# Patient Record
Sex: Male | Born: 1975 | Race: White | Hispanic: No | Smoking: Current every day smoker
Health system: Southern US, Community
[De-identification: ages and names within clinical notes are randomized; demographics above are authoritative.]

## PROBLEM LIST (undated history)

## (undated) DIAGNOSIS — I509 Heart failure, unspecified: Secondary | ICD-10-CM

## (undated) DIAGNOSIS — F1990 Other psychoactive substance use, unspecified, uncomplicated: Secondary | ICD-10-CM

## (undated) DIAGNOSIS — F1410 Cocaine abuse, uncomplicated: Secondary | ICD-10-CM

## (undated) DIAGNOSIS — E119 Type 2 diabetes mellitus without complications: Secondary | ICD-10-CM

## (undated) DIAGNOSIS — I219 Acute myocardial infarction, unspecified: Secondary | ICD-10-CM

## (undated) DIAGNOSIS — N189 Chronic kidney disease, unspecified: Secondary | ICD-10-CM

## (undated) DIAGNOSIS — I10 Essential (primary) hypertension: Secondary | ICD-10-CM

## (undated) DIAGNOSIS — F1110 Opioid abuse, uncomplicated: Secondary | ICD-10-CM

## (undated) HISTORY — PX: ANTERIOR CRUCIATE LIGAMENT REPAIR: SHX115

## (undated) HISTORY — DX: Chronic kidney disease, unspecified: N18.9

---
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
## 2008-09-26 ENCOUNTER — Emergency Department (HOSPITAL_COMMUNITY): Admission: EM | Admit: 2008-09-26 | Discharge: 2008-09-26 | Payer: Self-pay | Admitting: Emergency Medicine

## 2008-09-30 ENCOUNTER — Emergency Department (HOSPITAL_COMMUNITY): Admission: EM | Admit: 2008-09-30 | Discharge: 2008-10-01 | Payer: Self-pay | Admitting: Emergency Medicine

## 2009-02-14 ENCOUNTER — Emergency Department (HOSPITAL_COMMUNITY): Admission: EM | Admit: 2009-02-14 | Discharge: 2009-02-14 | Payer: Self-pay | Admitting: Emergency Medicine

## 2009-02-16 ENCOUNTER — Emergency Department (HOSPITAL_COMMUNITY): Admission: EM | Admit: 2009-02-16 | Discharge: 2009-02-16 | Payer: Self-pay | Admitting: Emergency Medicine

## 2012-10-14 ENCOUNTER — Encounter (INDEPENDENT_AMBULATORY_CARE_PROVIDER_SITE_OTHER): Payer: Self-pay | Admitting: General Surgery

## 2012-10-16 ENCOUNTER — Ambulatory Visit (INDEPENDENT_AMBULATORY_CARE_PROVIDER_SITE_OTHER): Payer: Managed Care, Other (non HMO) | Admitting: General Surgery

## 2014-05-14 DIAGNOSIS — I219 Acute myocardial infarction, unspecified: Secondary | ICD-10-CM

## 2014-05-14 HISTORY — DX: Acute myocardial infarction, unspecified: I21.9

## 2014-07-19 ENCOUNTER — Emergency Department (HOSPITAL_COMMUNITY)
Admission: EM | Admit: 2014-07-19 | Discharge: 2014-07-20 | Discharge status: Against Medical Advice | Payer: Managed Care, Other (non HMO) | Attending: Emergency Medicine | Admitting: Emergency Medicine

## 2014-07-19 ENCOUNTER — Encounter (HOSPITAL_COMMUNITY): Payer: Self-pay | Admitting: Emergency Medicine

## 2014-07-19 DIAGNOSIS — N189 Chronic kidney disease, unspecified: Secondary | ICD-10-CM | POA: Diagnosis not present

## 2014-07-19 DIAGNOSIS — R000 Tachycardia, unspecified: Secondary | ICD-10-CM | POA: Insufficient documentation

## 2014-07-19 DIAGNOSIS — R0602 Shortness of breath: Secondary | ICD-10-CM | POA: Insufficient documentation

## 2014-07-19 DIAGNOSIS — M7989 Other specified soft tissue disorders: Secondary | ICD-10-CM | POA: Insufficient documentation

## 2014-07-19 DIAGNOSIS — R61 Generalized hyperhidrosis: Secondary | ICD-10-CM | POA: Insufficient documentation

## 2014-07-19 DIAGNOSIS — R079 Chest pain, unspecified: Secondary | ICD-10-CM | POA: Insufficient documentation

## 2014-07-19 DIAGNOSIS — R200 Anesthesia of skin: Secondary | ICD-10-CM | POA: Diagnosis not present

## 2014-07-19 DIAGNOSIS — Z720 Tobacco use: Secondary | ICD-10-CM | POA: Diagnosis not present

## 2014-07-19 DIAGNOSIS — I201 Angina pectoris with documented spasm: Secondary | ICD-10-CM | POA: Diagnosis not present

## 2014-07-19 DIAGNOSIS — F419 Anxiety disorder, unspecified: Secondary | ICD-10-CM | POA: Insufficient documentation

## 2014-07-19 LAB — CBC
HCT: 37.9 % — ABNORMAL LOW (ref 39.0–52.0)
Hemoglobin: 12.1 g/dL — ABNORMAL LOW (ref 13.0–17.0)
MCH: 29.2 pg (ref 26.0–34.0)
MCHC: 31.9 g/dL (ref 30.0–36.0)
MCV: 91.3 fL (ref 78.0–100.0)
Platelets: 309 10*3/uL (ref 150–400)
RBC: 4.15 MIL/uL — AB (ref 4.22–5.81)
RDW: 12.7 % (ref 11.5–15.5)
WBC: 8.7 10*3/uL (ref 4.0–10.5)

## 2014-07-19 LAB — TROPONIN I: TROPONIN I: 0.13 ng/mL — AB (ref <0.031)

## 2014-07-19 LAB — BASIC METABOLIC PANEL
Anion gap: 7 (ref 5–15)
BUN: 17 mg/dL (ref 6–23)
CALCIUM: 8.8 mg/dL (ref 8.4–10.5)
CO2: 28 mmol/L (ref 19–32)
Chloride: 102 mmol/L (ref 96–112)
Creatinine, Ser: 0.95 mg/dL (ref 0.50–1.35)
GFR calc Af Amer: >90 mL/min (ref >90)
GFR calc non Af Amer: >90 mL/min (ref >90)
GLUCOSE: 133 mg/dL — AB (ref 70–99)
POTASSIUM: 3.7 mmol/L (ref 3.5–5.1)
SODIUM: 137 mmol/L (ref 135–145)

## 2014-07-19 LAB — BRAIN NATRIURETIC PEPTIDE: B Natriuretic Peptide: 143.1 pg/mL — ABNORMAL HIGH (ref 0.0–100.0)

## 2014-07-19 NOTE — ED Notes (Signed)
Pt states he has been having chest pain off and on for the past three to four weeks  States the pain is in his left chest up toward his left shoulder  Pt describes it as a pressure  Denies pain at this time but states when it comes it is a 6 or 7 on pain scale  Pt states he has also been having shortness of breath for the past 2 weeks that is worse on exertion   Pt states he has swelling in his lower extremities worse in the right than the left  Pt states he has numbness in his left foot from about the middle of his foot down to his toes

## 2014-07-20 ENCOUNTER — Encounter (HOSPITAL_COMMUNITY): Payer: Self-pay

## 2014-07-20 ENCOUNTER — Emergency Department (HOSPITAL_COMMUNITY): Payer: Managed Care, Other (non HMO)

## 2014-07-20 ENCOUNTER — Encounter (HOSPITAL_COMMUNITY): Payer: Self-pay | Admitting: Emergency Medicine

## 2014-07-20 ENCOUNTER — Inpatient Hospital Stay (HOSPITAL_COMMUNITY)
Admission: EM | Admit: 2014-07-20 | Discharge: 2014-07-22 | DRG: 287 | Disposition: A | Discharge status: Home / Self Care | Payer: Managed Care, Other (non HMO) | Attending: Internal Medicine | Admitting: Internal Medicine

## 2014-07-20 DIAGNOSIS — R7989 Other specified abnormal findings of blood chemistry: Secondary | ICD-10-CM | POA: Diagnosis present

## 2014-07-20 DIAGNOSIS — I5032 Chronic diastolic (congestive) heart failure: Secondary | ICD-10-CM | POA: Insufficient documentation

## 2014-07-20 DIAGNOSIS — R0600 Dyspnea, unspecified: Secondary | ICD-10-CM

## 2014-07-20 DIAGNOSIS — M1990 Unspecified osteoarthritis, unspecified site: Secondary | ICD-10-CM | POA: Diagnosis present

## 2014-07-20 DIAGNOSIS — R778 Other specified abnormalities of plasma proteins: Secondary | ICD-10-CM

## 2014-07-20 DIAGNOSIS — I129 Hypertensive chronic kidney disease with stage 1 through stage 4 chronic kidney disease, or unspecified chronic kidney disease: Secondary | ICD-10-CM | POA: Diagnosis present

## 2014-07-20 DIAGNOSIS — N189 Chronic kidney disease, unspecified: Secondary | ICD-10-CM | POA: Diagnosis present

## 2014-07-20 DIAGNOSIS — F17210 Nicotine dependence, cigarettes, uncomplicated: Secondary | ICD-10-CM | POA: Diagnosis present

## 2014-07-20 DIAGNOSIS — I420 Dilated cardiomyopathy: Secondary | ICD-10-CM | POA: Diagnosis present

## 2014-07-20 DIAGNOSIS — Z8249 Family history of ischemic heart disease and other diseases of the circulatory system: Secondary | ICD-10-CM

## 2014-07-20 DIAGNOSIS — M7989 Other specified soft tissue disorders: Secondary | ICD-10-CM | POA: Diagnosis present

## 2014-07-20 DIAGNOSIS — R748 Abnormal levels of other serum enzymes: Secondary | ICD-10-CM | POA: Diagnosis present

## 2014-07-20 DIAGNOSIS — Z6841 Body Mass Index (BMI) 40.0 and over, adult: Secondary | ICD-10-CM

## 2014-07-20 DIAGNOSIS — I10 Essential (primary) hypertension: Secondary | ICD-10-CM | POA: Insufficient documentation

## 2014-07-20 DIAGNOSIS — F1410 Cocaine abuse, uncomplicated: Secondary | ICD-10-CM | POA: Diagnosis present

## 2014-07-20 DIAGNOSIS — R079 Chest pain, unspecified: Secondary | ICD-10-CM | POA: Insufficient documentation

## 2014-07-20 DIAGNOSIS — E119 Type 2 diabetes mellitus without complications: Secondary | ICD-10-CM

## 2014-07-20 DIAGNOSIS — R0602 Shortness of breath: Secondary | ICD-10-CM | POA: Diagnosis present

## 2014-07-20 DIAGNOSIS — F1110 Opioid abuse, uncomplicated: Secondary | ICD-10-CM | POA: Diagnosis present

## 2014-07-20 DIAGNOSIS — I201 Angina pectoris with documented spasm: Principal | ICD-10-CM | POA: Diagnosis present

## 2014-07-20 DIAGNOSIS — I252 Old myocardial infarction: Secondary | ICD-10-CM

## 2014-07-20 DIAGNOSIS — F1990 Other psychoactive substance use, unspecified, uncomplicated: Secondary | ICD-10-CM

## 2014-07-20 LAB — CBC
HEMATOCRIT: 38.7 % — AB (ref 39.0–52.0)
Hemoglobin: 12.3 g/dL — ABNORMAL LOW (ref 13.0–17.0)
MCH: 29 pg (ref 26.0–34.0)
MCHC: 31.8 g/dL (ref 30.0–36.0)
MCV: 91.3 fL (ref 78.0–100.0)
Platelets: 307 10*3/uL (ref 150–400)
RBC: 4.24 MIL/uL (ref 4.22–5.81)
RDW: 12.7 % (ref 11.5–15.5)
WBC: 8.4 10*3/uL (ref 4.0–10.5)

## 2014-07-20 LAB — TSH: TSH: 1.143 u[IU]/mL (ref 0.350–4.500)

## 2014-07-20 LAB — TROPONIN I
TROPONIN I: 0.15 ng/mL — AB (ref <0.031)
TROPONIN I: 0.14 ng/mL — AB (ref <0.031)
Troponin I: 0.14 ng/mL — ABNORMAL HIGH (ref <0.031)

## 2014-07-20 LAB — ACETAMINOPHEN LEVEL: Acetaminophen (Tylenol), Serum: <10 ug/mL — ABNORMAL LOW (ref 10–30)

## 2014-07-20 LAB — RAPID URINE DRUG SCREEN, HOSP PERFORMED
AMPHETAMINES: NOT DETECTED
Barbiturates: NOT DETECTED
Benzodiazepines: NOT DETECTED
COCAINE: POSITIVE — AB
Opiates: POSITIVE — AB
Tetrahydrocannabinol: NOT DETECTED

## 2014-07-20 LAB — SALICYLATE LEVEL: Salicylate Lvl: <4 mg/dL (ref 2.8–20.0)

## 2014-07-20 LAB — CREATININE, SERUM
CREATININE: 1.04 mg/dL (ref 0.50–1.35)
GFR calc Af Amer: >90 mL/min (ref >90)
GFR, EST NON AFRICAN AMERICAN: 89 mL/min — AB (ref >90)

## 2014-07-20 LAB — D-DIMER, QUANTITATIVE: D-Dimer, Quant: 1.21 ug/mL-FEU — ABNORMAL HIGH (ref 0.00–0.48)

## 2014-07-20 LAB — BRAIN NATRIURETIC PEPTIDE: B NATRIURETIC PEPTIDE 5: 143 pg/mL — AB (ref 0.0–100.0)

## 2014-07-20 MED ORDER — ACETAMINOPHEN 325 MG PO TABS: 650.0000 mg | ORAL_TABLET | Freq: Four times a day (QID) | ORAL | Status: DC | PRN

## 2014-07-20 MED ORDER — IOHEXOL 350 MG/ML SOLN
100.0000 mL | Freq: Once | INTRAVENOUS | Status: AC | PRN
  Administered 2014-07-20: 100 mL via INTRAVENOUS

## 2014-07-20 MED ORDER — SODIUM CHLORIDE 0.9 % IJ SOLN: 3.0000 mL | INTRAMUSCULAR | Status: DC | PRN

## 2014-07-20 MED ORDER — TRAMADOL HCL 50 MG PO TABS
50.0000 mg | ORAL_TABLET | Freq: Four times a day (QID) | ORAL | Status: DC | PRN
  Administered 2014-07-20 – 2014-07-21 (×2): 50 mg via ORAL
  Filled 2014-07-20 (×2): qty 1

## 2014-07-20 MED ORDER — SODIUM CHLORIDE 0.9 % IV BOLUS (SEPSIS)
1000.0000 mL | Freq: Once | INTRAVENOUS | Status: AC
  Administered 2014-07-20: 1000 mL via INTRAVENOUS

## 2014-07-20 MED ORDER — SODIUM CHLORIDE 0.9 % IV BOLUS (SEPSIS)
1000.0000 mL | Freq: Once | INTRAVENOUS | Status: DC
Start: 2014-07-20 — End: 2014-07-20

## 2014-07-20 MED ORDER — POLYETHYLENE GLYCOL 3350 17 G PO PACK
17.0000 g | PACK | Freq: Every day | ORAL | Status: DC | PRN
  Filled 2014-07-20: qty 1

## 2014-07-20 MED ORDER — IOHEXOL 350 MG/ML SOLN: 100.0000 mL | Freq: Once | INTRAVENOUS | Status: DC | PRN

## 2014-07-20 MED ORDER — ZOLPIDEM TARTRATE 5 MG PO TABS: 5.0000 mg | ORAL_TABLET | Freq: Every evening | ORAL | Status: DC | PRN

## 2014-07-20 MED ORDER — ONDANSETRON HCL 4 MG PO TABS: 4.0000 mg | ORAL_TABLET | Freq: Four times a day (QID) | ORAL | Status: DC | PRN

## 2014-07-20 MED ORDER — CLONIDINE HCL 0.1 MG PO TABS
0.1000 mg | ORAL_TABLET | Freq: Once | ORAL | Status: AC
  Administered 2014-07-20: 0.1 mg via ORAL
  Filled 2014-07-20: qty 1

## 2014-07-20 MED ORDER — NICOTINE 21 MG/24HR TD PT24
21.0000 mg | MEDICATED_PATCH | Freq: Every day | TRANSDERMAL | Status: DC
Start: 2014-07-20 — End: 2014-07-23
  Administered 2014-07-20 – 2014-07-22 (×3): 21 mg via TRANSDERMAL
  Filled 2014-07-20 (×3): qty 1

## 2014-07-20 MED ORDER — HEPARIN SODIUM (PORCINE) 5000 UNIT/ML IJ SOLN
5000.0000 [IU] | Freq: Three times a day (TID) | INTRAMUSCULAR | Status: DC
  Administered 2014-07-20 – 2014-07-21 (×3): 5000 [IU] via SUBCUTANEOUS
  Filled 2014-07-20 (×3): qty 1

## 2014-07-20 MED ORDER — ONDANSETRON HCL 4 MG/2ML IJ SOLN: 4.0000 mg | Freq: Four times a day (QID) | INTRAMUSCULAR | Status: DC | PRN

## 2014-07-20 MED ORDER — IBUPROFEN 800 MG PO TABS
800.0000 mg | ORAL_TABLET | Freq: Four times a day (QID) | ORAL | Status: DC | PRN
  Administered 2014-07-21: 800 mg via ORAL
  Filled 2014-07-20: qty 1

## 2014-07-20 MED ORDER — ACETAMINOPHEN 650 MG RE SUPP
650.0000 mg | Freq: Four times a day (QID) | RECTAL | Status: DC | PRN
Start: 2014-07-20 — End: 2014-07-23

## 2014-07-20 MED ORDER — SODIUM CHLORIDE 0.9 % IV SOLN: 250.0000 mL | INTRAVENOUS | Status: DC | PRN

## 2014-07-20 MED ORDER — SODIUM CHLORIDE 0.9 % IJ SOLN
3.0000 mL | Freq: Two times a day (BID) | INTRAMUSCULAR | Status: DC
  Administered 2014-07-20 – 2014-07-21 (×2): 3 mL via INTRAVENOUS

## 2014-07-20 NOTE — Progress Notes (Signed)
Pt Larry Mason left the ER and went outside without telling any person on staff. Pts friend at bedside tried calling him but no answer. Pt did however text him and say he was in the parking lot. Friend is outside trying to talkk him into coming back and getting treated. Md aware of this situation Juanda Bond Registered Nurse,  Emergency Department

## 2014-07-20 NOTE — ED Provider Notes (Signed)
CSN: [id number]     Arrival date & time 07/19/14  2220 History   First MD Initiated Contact with Patient 07/20/14 0159     Chief Complaint  Patient presents with  . Chest Pain  . Shortness of Breath  . Leg Swelling    (Consider location/radiation/quality/duration/timing/severity/associated sxs/prior Treatment) HPI Comments: Patient is a 39 year old male who presents to the emergency department for further evaluation of chest pain which he describes as a pressure. Patient states that pain has been intermittent over the past 5 days. Patient states that his pain came on suddenly 5 days ago and radiated down his left arm. Patient describes the pain as "a bruising pain". He states that it lasted for approximately 1-2 hours before spontaneously resolving. He reports feeling short of breath as well as diaphoretic at onset of his symptoms. Patient reports that his chest pain has been coming and going since this time. He states that he notices the pressure mostly when he is lying flat. He reports that he feels a sensation of pressure, also, in his "body and face". He has noted that he has had some swelling to his right foot as well as subjective numbness to the plantar aspect of his left foot. Patient states "I feel like I'm walking on a rock all day". Patient denies any associated fever, nausea, vomiting, or syncope. He denies any recent prolonged travel or recent surgeries/hospitalizations. Patient denies a family history of ACS as well as a family history of DVT/PE. Patient has no personal history of ACS, DVT, PE, hypertension, or diabetes mellitus. He does report IV opiate drug use over the past year with his most recent use approximately one week ago.  Patient is a 39 y.o. male presenting with chest pain and shortness of breath. The history is provided by the patient. No language interpreter was used.  Chest Pain Associated symptoms: diaphoresis, numbness and shortness of breath   Associated symptoms: no  back pain, no fever, no nausea and not vomiting   Shortness of Breath Associated symptoms: chest pain and diaphoresis   Associated symptoms: no fever and no vomiting     Past Medical History  Diagnosis Date  . Chronic kidney disease 1984   Past Surgical History  Procedure Laterality Date  . Knee surgery     History reviewed. No pertinent family history. History  Substance Use Topics  . Smoking status: Current Every Day Smoker  . Smokeless tobacco: Not on file  . Alcohol Use: No    Review of Systems  Constitutional: Positive for diaphoresis. Negative for fever.  Respiratory: Positive for shortness of breath.   Cardiovascular: Positive for chest pain and leg swelling.  Gastrointestinal: Negative for nausea and vomiting.  Musculoskeletal: Negative for back pain.  Neurological: Positive for numbness. Negative for syncope.  All other systems reviewed and are negative.   Allergies  Review of patient's allergies indicates no known allergies.  Home Medications   Prior to Admission medications   Medication Sig Start Date End Date Taking? Authorizing Provider  ibuprofen (ADVIL,MOTRIN) 200 MG tablet Take 800 mg by mouth every 6 (six) hours as needed for moderate pain (pain and swelling).   Yes Historical Provider, MD   BP 143/75 mmHg  Pulse 92  Temp(Src) 98.5 F (36.9 C) (Oral)  Resp 18  Ht  (1.702 m)  Wt 260 lb (117.935 kg)  BMI 40.71 kg/m2  SpO2 96%   Physical Exam  Constitutional: He is oriented to person, place, and time. He appears  well-developed and well-nourished. No distress.  Nontoxic/nonseptic appearing  HENT:  Head: Normocephalic and atraumatic.  Eyes: Conjunctivae and EOM are normal. No scleral icterus.  Neck: Normal range of motion.  No JVD  Cardiovascular: Regular rhythm and intact distal pulses.   Tachycardic rate  Pulmonary/Chest: Effort normal. No respiratory distress. He has no wheezes. He has no rales.  Respirations even and unlabored   Musculoskeletal: Normal range of motion. He exhibits edema.  RLE swelling noted in R foot; no pitting edema  Neurological: He is alert and oriented to person, place, and time. He exhibits normal muscle tone. Coordination normal.  GCS 15. Patient moves extremities without ataxia. He ambulates with steady gait.  Skin: Skin is warm and dry. No rash noted. He is not diaphoretic. No erythema. No pallor.  Psychiatric: His speech is normal. His mood appears anxious. He is agitated.  Nursing note and vitals reviewed.   ED Course  Procedures (including critical care time) Labs Review Labs Reviewed  CBC - Abnormal; Notable for the following:    RBC 4.15 (*)    Hemoglobin 12.1 (*)    HCT 37.9 (*)    All other components within normal limits  BASIC METABOLIC PANEL - Abnormal; Notable for the following:    Glucose, Bld 133 (*)    All other components within normal limits  BRAIN NATRIURETIC PEPTIDE - Abnormal; Notable for the following:    B Natriuretic Peptide 143.1 (*)    All other components within normal limits  TROPONIN I - Abnormal; Notable for the following:    Troponin I 0.13 (*)    All other components within normal limits  TROPONIN I - Abnormal; Notable for the following:    Troponin I 0.15 (*)    All other components within normal limits  D-DIMER, QUANTITATIVE - Abnormal; Notable for the following:    D-Dimer, Quant 1.21 (*)    All other components within normal limits  TROPONIN I  URINE RAPID DRUG SCREEN (HOSP PERFORMED)  ETHANOL  ACETAMINOPHEN LEVEL  SALICYLATE LEVEL    Imaging Review Dg Chest 2 View  07/20/2014   CLINICAL DATA:  Intermittent chest pain for 3-4 weeks, chest pressure and shortness of breath. Lower extremity swelling and numbness.  EXAM: CHEST  2 VIEW  COMPARISON:  None.  FINDINGS: Cardiac silhouette is mildly enlarged, mediastinal silhouette is unremarkable. The lungs are clear without pleural effusions or focal consolidations. Trachea projects midline and  there is no pneumothorax. Soft tissue planes and included osseous structures are non-suspicious.  IMPRESSION: Mild cardiomegaly, no acute pulmonary process.   Electronically Signed   By: Awilda Metro   On: 07/20/2014 02:40     EKG Interpretation   Date/Time:  Monday July 19 2014 22:28:52 EST Ventricular Rate:  126 PR Interval:  126 QRS Duration: 98 QT Interval:  336 QTC Calculation: 486 R Axis:   14 Text Interpretation:  Sinus tachycardia Left ventricular hypertrophy  Nonspecific T wave abnormality Abnormal ECG Confirmed by OTTER  MD, OLGA  ([id number]) on 07/20/2014 4:31:06 AM      MDM   Final diagnoses:  Chest pain    0300 - Patient is a 39 year old male who presents to the emergency department for further evaluation of chest pain. Per nurse, patient became very anxious following my encounter with him because of his high likelihood of being admitted to the hospital. Patient expressed concern over his drug use history and withdrawal symptoms. Patient left the emergency department as a result. His friend is  attempting to locate him in hopes of him coming back to the ED to have his work up completed. Of note, Troponin-I increased from 0.13 to 0.15 on 3 hour redraw. EKG nonischemic. D dimer elevated to 1.21. Plan for CT angio study if patient returns.  7048 - Patient has not returned to the ED. Plan to disposition - Eloped/AMA.   Filed Vitals:   07/19/14 2234 07/20/14 0058  BP: 170/89 143/75  Pulse: 118 92  Temp: 98.5 F (36.9 C)   TempSrc: Oral   Resp: 18 18  Height: 5\' 7"  (1.702 m)   Weight: 260 lb (117.935 kg)   SpO2: 95% 96%     Antony Madura, PA-C 07/20/14 0413  Antony Madura, PA-C 07/20/14 8891  Marisa Severin, MD 07/20/14 [phone]

## 2014-07-20 NOTE — ED Notes (Signed)
Pt was seen here earlier tonight with c/o chest pain, shortness of breath and lower extremity edema  Pt had a complete work up that showed an elevated troponin and d dimer  The dr spoke to pt about being admitted into the hospital  Pt states he became anxious and left to go talk to his family and get his child to his ex wife  Pt returns this morning to be admitted

## 2014-07-20 NOTE — Progress Notes (Signed)
  Echocardiogram 2D Echocardiogram has been performed.  Delcie Roch 07/20/2014, 5:39 PM

## 2014-07-20 NOTE — ED Notes (Signed)
Patient ambulated to X-ray 

## 2014-07-20 NOTE — H&P (Addendum)
[facility] History and Physical  Larry Mason UDJ:[id number] DOB: 1975/07/20 DOA: 07/20/2014  Referring physician: Dr. Loretha Stapler PCP: No primary care provider on file.   Chief Complaint: SOB  HPI: Larry Mason is a 39 y.o. male no significant past medical history that comes left-sided chest pain and shortness of breath with radiation to the arm left arm. He relates his shortness of breath has progressively gotten worst. He relates it came on suddenly with radiating to his left arm. He relates some shortness of breath with diaphoresis. He also relates that his shortness of breath and chest pain is made worse with exertion. He also relates some swelling of his right foot where he has had a couple of surgeries. And some numbness on his left foot and he denies any fever, nausea, vomiting, diarrhea or syncope, he denies any long plane rides or car rides, also any recent leg surgeries. He does relate a positive history of IV drug abuse and cocaine.  In the ED: Basic metabolic panel was done that was within normal limits he has had 3 sets of cardiac enzymes which mildly positive but not increasing, his EKG just showed left ventricular hypertrophy. UDS positive for opiates and cocaine, his d-dimer was positive at 1.2 CT image of the chest was done that shows no PE and a CBC shows a hemoglobin of 12.1. BMP was 143.   Review of Systems:  Constitutional:  No weight loss, night sweats, Fevers, chills, fatigue.  HEENT:  No headaches, Difficulty swallowing,Tooth/dental problems,Sore throat,  No sneezing, itching, ear ache, nasal congestion, post nasal drip,  Cardio-vascular:  Orthopnea, PND, swelling in lower extremities, anasarca, dizziness, palpitations  GI:  No heartburn, indigestion, abdominal pain, nausea, vomiting, diarrhea, change in bowel habits, loss of appetite  Resp:   No coughing up of blood.No change in color of mucus.No wheezing.No chest wall deformity  Skin:  no rash or  lesions.  GU:  no dysuria, change in color of urine, no urgency or frequency. No flank pain.  Musculoskeletal:  No joint pain or swelling. No decreased range of motion. No back pain.  Psych:  No change in mood or affect. No depression or anxiety. No memory loss.   Past Medical History  Diagnosis Date  . Chronic kidney disease 1984   Past Surgical History  Procedure Laterality Date  . Knee surgery     Social History:  reports that he has been smoking Cigarettes.  He does not have any smokeless tobacco history on file. He reports that he uses illicit drugs. He reports that he does not drink alcohol.  No Known Allergies  Family History  Problem Relation Age of Onset  . Diabetes Mellitus II Mother   . Hypertension Father     Prior to Admission medications   Medication Sig Start Date End Date Taking? Authorizing Provider  ibuprofen (ADVIL,MOTRIN) 200 MG tablet Take 800 mg by mouth every 6 (six) hours as needed for moderate pain (pain and swelling).   Yes Historical Provider, MD   Physical Exam: Filed Vitals:   07/20/14 1000 07/20/14 1030 07/20/14 1100 07/20/14 1134  BP: 118/101 142/92 165/101 148/79  Pulse: 88 76 104 82  Temp:    98.2 F (36.8 C)  TempSrc:    Oral  Resp: 12 12 23 20   Height:    5\' 7"  (1.702 m)  Weight:    117.935 kg (260 lb)  SpO2: 96% 92% 94% 95%    Wt Readings from Last 3 Encounters:  07/20/14 117.935 kg (260 lb)  07/19/14 117.935 kg (260 lb)    General:  Appears calm and comfortable Eyes: PERRL, normal lids, irises & conjunctiva ENT: grossly normal hearing, lips & tongue Neck: no LAD, masses or thyromegaly Cardiovascular: RRR, I think he has an increase in his P2 Respiratory: CTA bilaterally, no wheezing good air movement Normal respiratory effort. Abdomen: soft, ntnd Skin: no rash or induration seen on limited exam Musculoskeletal: His right lower extremities most edematous than the left. Is had multiple surgeries on the right  foot Psychiatric: grossly normal mood and affect, speech fluent and appropriate Neurologic: grossly non-focal.          Labs on Admission:  Basic Metabolic Panel:  Recent Labs Lab 07/19/14 2248  NA 137  K 3.7  CL 102  CO2 28  GLUCOSE 133*  BUN 17  CREATININE 0.95  CALCIUM 8.8   Liver Function Tests: No results for input(s): AST, ALT, ALKPHOS, BILITOT, PROT, ALBUMIN in the last 168 hours. No results for input(s): LIPASE, AMYLASE in the last 168 hours. No results for input(s): AMMONIA in the last 168 hours. CBC:  Recent Labs Lab 07/19/14 2248  WBC 8.7  HGB 12.1*  HCT 37.9*  MCV 91.3  PLT 309   Cardiac Enzymes:  Recent Labs Lab 07/19/14 2248 07/20/14 0218 07/20/14 0813  TROPONINI 0.13* 0.15* 0.14*    BNP (last 3 results)  Recent Labs  07/19/14 2248 07/20/14 0815  BNP 143.1* 143.0*    ProBNP (last 3 results) No results for input(s): PROBNP in the last 8760 hours.  CBG: No results for input(s): GLUCAP in the last 168 hours.  Radiological Exams on Admission: Dg Chest 2 View  07/20/2014   CLINICAL DATA:  Intermittent chest pain for 3-4 weeks, chest pressure and shortness of breath. Lower extremity swelling and numbness.  EXAM: CHEST  2 VIEW  COMPARISON:  None.  FINDINGS: Cardiac silhouette is mildly enlarged, mediastinal silhouette is unremarkable. The lungs are clear without pleural effusions or focal consolidations. Trachea projects midline and there is no pneumothorax. Soft tissue planes and included osseous structures are non-suspicious.  IMPRESSION: Mild cardiomegaly, no acute pulmonary process.   Electronically Signed   By: Awilda Metro   On: 07/20/2014 02:40   Ct Angio Chest Pe W/cm &/or Wo Cm  07/20/2014   CLINICAL DATA:  39 year old male with shortness of breath and left chest pain radiating toward the left shoulder. D-dimer elevated. Evaluate for pulmonary embolus.  EXAM: CT ANGIOGRAPHY CHEST WITH CONTRAST  TECHNIQUE: Multidetector CT imaging of  the chest was performed using the standard protocol during bolus administration of intravenous contrast. Multiplanar CT image reconstructions and MIPs were obtained to evaluate the vascular anatomy.  CONTRAST:  OMNIPAQUE IOHEXOL 350 MG/ML SOLN  COMPARISON:  Chest x-ray obtained earlier today  FINDINGS: Mediastinum: Mildly prominent prevascular lymph node measures 9 mm in short axis. Given the patient's demographics, this is likely reactive. No additional suspicious mediastinal or hilar lymph nodes. No anterior mediastinal mass. Visualized thyroid gland is within normal limits. Unremarkable thoracic esophagus.  Heart/Vascular: Adequate opacification of the main and lobar pulmonary arteries. Opacification of the segmental and subsegmental vessels is suboptimal. No evidence of central filling defect to suggest pulmonary embolus. Right brachiocephalic and left common carotid artery share a common origin. No aneurysmal dilatation of the ascending aorta. No evidence of dissection. Borderline cardiomegaly with marked left ventricular dilatation. The transverse diameter of the left ventricle measures 80 mm almost twice the expected norm.  Although limited by non cardiac gated technique, no definite calcified plaque identified along the course of the coronary arteries.  Lungs/Pleura: No pleural effusion. Calcified granuloma in the periphery of the right upper lobe.  Bones/Soft Tissues: No acute fracture or aggressive appearing lytic or blastic osseous lesion.  Upper Abdomen: Visualized upper abdominal organs are unremarkable.  Review of the MIP images confirms the above findings.  IMPRESSION: 1. Negative for acute pulmonary embolus to the lobar pulmonary arterial level. 2. Cardiomegaly with age advanced left ventricular dilatation and a suggestion of hypokinesis. Primary differential considerations include both ischemic and nonischemic cardiomyopathy. Although this study is slightly limited by non cardiac gated  technique, no definite calcified plaque identified along the course of the coronary arteries thus favoring nonischemic cardiomyopathy. Recommend cardiology consultation and echocardiogram for further evaluation.   Electronically Signed   By: Malachy Moan M.D.   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 He's had a recent upper respiratory tract infection.  - We will admit him to telemetry, cycle his cardiac enzymes, get a 2-D echo to further delineate his heart structure. - Depending on the results of the 2-D echo will call cardiology.  - CT angiogram chest was negative for PE.   Swelling of right lower extremity: - Check a lower extremity Doppler to rule out a DVT. - Is most likely due to the multiple surgeries she's had on that right lower extremity none of them recently.  Heroin abuse/Cocaine abuse - He relates he has only use heroin a few times, and has cocaine recently.  - He relates he is not a chronic cocaine abuser.    Code Status: full DVT Prophylaxis:heparin Family Communication: none Disposition Plan: inpatient  Time spent: 80 minutes  Marinda Elk [facility] Pager [phone]

## 2014-07-20 NOTE — Progress Notes (Signed)
*  Preliminary Results* Bilateral lower extremity venous duplex completed. Bilateral lower extremities are negative for deep vein thrombosis. There is no evidence of Baker's cyst bilaterally.  07/20/2014  Sanyiah Kanzler, RVT, RDCS, RDMS  

## 2014-07-20 NOTE — ED Provider Notes (Signed)
CSN: [id number]     Arrival date & time 07/20/14  0612 History   First MD Initiated Contact with Patient 07/20/14 0703     Chief Complaint  Patient presents with  . Abnormal Lab     (Consider location/radiation/quality/duration/timing/severity/associated sxs/prior Treatment) Patient is a 39 y.o. male presenting with chest pain.  Chest Pain Pain location:  L chest Pain quality comment:  Deep Radiates to: left arm. Pain severity:  Moderate Onset quality:  Sudden Duration: several days ago. Timing:  Intermittent Progression since onset: none currently. Chronicity:  New Context comment:  Started while he was up late at night doing houswork several nights ago Relieved by:  Nothing Worsened by:  Nothing tried Associated symptoms comment:  Right leg swelling.  Left foot tingling. Shortness of breath which pt attributes to smoking and being overweight.   Risk factors comment:  IV drug use, cocaine use.     Past Medical History  Diagnosis Date  . Chronic kidney disease 1984   Past Surgical History  Procedure Laterality Date  . Knee surgery     History reviewed. No pertinent family history. History  Substance Use Topics  . Smoking status: Current Every Day Smoker  . Smokeless tobacco: Not on file  . Alcohol Use: No    Review of Systems  Cardiovascular: Positive for chest pain.  All other systems reviewed and are negative.     Allergies  Review of patient's allergies indicates no known allergies.  Home Medications   Prior to Admission medications   Medication Sig Start Date End Date Taking? Authorizing Provider  ibuprofen (ADVIL,MOTRIN) 200 MG tablet Take 800 mg by mouth every 6 (six) hours as needed for moderate pain (pain and swelling).   Yes Historical Provider, MD   BP 171/94 mmHg  Pulse 103  Temp(Src) 98.2 F (36.8 C) (Oral)  Resp 20  SpO2 93% Physical Exam  Constitutional: He is oriented to person, place, and time. He appears well-developed and  well-nourished. No distress.  HENT:  Head: Normocephalic and atraumatic.  Mouth/Throat: Oropharynx is clear and moist.  Eyes: Conjunctivae are normal. Pupils are equal, round, and reactive to light. No scleral icterus.  Neck: Neck supple.  Cardiovascular: Normal rate, regular rhythm, normal heart sounds and intact distal pulses.   No murmur heard. Pulmonary/Chest: Effort normal and breath sounds normal. No stridor. No respiratory distress. He has no wheezes. He has no rales.  Abdominal: Soft. He exhibits no distension. There is no tenderness.  Musculoskeletal: Normal range of motion. He exhibits edema (2+ pitting edema right leg to knee.  1+ pitting edema left ankle.  ).  Neurological: He is alert and oriented to person, place, and time.  Skin: Skin is warm. No rash noted. He is diaphoretic (slightly).  Psychiatric: His behavior is normal.  Slightly anxious  Nursing note and vitals reviewed.   ED Course  Procedures (including critical care time) Labs Review Labs Reviewed  TROPONIN I  URINE RAPID DRUG SCREEN (HOSP PERFORMED)  ACETAMINOPHEN LEVEL  SALICYLATE LEVEL    Imaging Review Dg Chest 2 View  07/20/2014   CLINICAL DATA:  Intermittent chest pain for 3-4 weeks, chest pressure and shortness of breath. Lower extremity swelling and numbness.  EXAM: CHEST  2 VIEW  COMPARISON:  None.  FINDINGS: Cardiac silhouette is mildly enlarged, mediastinal silhouette is unremarkable. The lungs are clear without pleural effusions or focal consolidations. Trachea projects midline and there is no pneumothorax. Soft tissue planes and included osseous structures are non-suspicious.  IMPRESSION: Mild cardiomegaly, no acute pulmonary process.   Electronically Signed   By: Awilda Metro   On: 07/20/2014 02:40     EKG Interpretation None      MDM   Final diagnoses:  Chest pain  Shortness of breath  Elevated troponin I level  Positive D dimer  IVDU (intravenous drug user)    39 yo male with  hx of IV drug abuse and cocaine abuse with chest pain and right leg edema.  He was seen last night but eloped from the ED.  He was convinced to return due to elevated troponin and d dimer.  Will continued workup including CTA PE study and repeat troponin.    Troponin remains elevated but flat.  No evidence of PE on CT scan, but does show evidence of cardiomegaly, possible cardiomyopathy.  Unclear whether his elevated troponin is secondary to cocaine abuse or other cardiac dysfunction, but I think he requires admission and further workup.  I spoke with Dr. David Stall who will admit to tele unit for further workup.    Blake Divine, MD 07/20/14 [phone]

## 2014-07-21 DIAGNOSIS — R079 Chest pain, unspecified: Secondary | ICD-10-CM

## 2014-07-21 DIAGNOSIS — I10 Essential (primary) hypertension: Secondary | ICD-10-CM

## 2014-07-21 LAB — COMPREHENSIVE METABOLIC PANEL
ALBUMIN: 3.5 g/dL (ref 3.5–5.2)
ALT: 75 U/L — AB (ref 0–53)
AST: 26 U/L (ref 0–37)
Alkaline Phosphatase: 47 U/L (ref 39–117)
Anion gap: 8 (ref 5–15)
BUN: 15 mg/dL (ref 6–23)
CALCIUM: 8.5 mg/dL (ref 8.4–10.5)
CO2: 28 mmol/L (ref 19–32)
Chloride: 100 mmol/L (ref 96–112)
Creatinine, Ser: 0.86 mg/dL (ref 0.50–1.35)
GFR calc non Af Amer: >90 mL/min (ref >90)
Glucose, Bld: 117 mg/dL — ABNORMAL HIGH (ref 70–99)
Potassium: 3.6 mmol/L (ref 3.5–5.1)
Sodium: 136 mmol/L (ref 135–145)
TOTAL PROTEIN: 6.4 g/dL (ref 6.0–8.3)
Total Bilirubin: 0.6 mg/dL (ref 0.3–1.2)

## 2014-07-21 LAB — APTT: APTT: 32 s (ref 24–37)

## 2014-07-21 LAB — PROTIME-INR
INR: 0.97 (ref 0.00–1.49)
PROTHROMBIN TIME: 13 s (ref 11.6–15.2)

## 2014-07-21 LAB — CBC
HCT: 38.2 % — ABNORMAL LOW (ref 39.0–52.0)
HEMOGLOBIN: 12.3 g/dL — AB (ref 13.0–17.0)
MCH: 29.4 pg (ref 26.0–34.0)
MCHC: 32.2 g/dL (ref 30.0–36.0)
MCV: 91.4 fL (ref 78.0–100.0)
Platelets: 315 10*3/uL (ref 150–400)
RBC: 4.18 MIL/uL — AB (ref 4.22–5.81)
RDW: 12.8 % (ref 11.5–15.5)
WBC: 7.2 10*3/uL (ref 4.0–10.5)

## 2014-07-21 LAB — TROPONIN I
TROPONIN I: 0.13 ng/mL — AB (ref <0.031)
Troponin I: 0.14 ng/mL — ABNORMAL HIGH (ref <0.031)

## 2014-07-21 MED ORDER — HEPARIN (PORCINE) IN NACL 100-0.45 UNIT/ML-% IJ SOLN
1300.0000 [IU]/h | INTRAMUSCULAR | Status: DC
  Administered 2014-07-21 (×2): 1300 [IU]/h via INTRAVENOUS
  Filled 2014-07-21: qty 250

## 2014-07-21 MED ORDER — LISINOPRIL 10 MG PO TABS
10.0000 mg | ORAL_TABLET | Freq: Every day | ORAL | Status: DC
  Administered 2014-07-22: 10 mg via ORAL
  Filled 2014-07-21: qty 1

## 2014-07-21 MED ORDER — ATORVASTATIN CALCIUM 80 MG PO TABS
80.0000 mg | ORAL_TABLET | Freq: Every day | ORAL | Status: DC
  Administered 2014-07-22: 80 mg via ORAL
  Filled 2014-07-21: qty 2
  Filled 2014-07-21: qty 1

## 2014-07-21 MED ORDER — SPIRONOLACTONE 25 MG PO TABS
25.0000 mg | ORAL_TABLET | Freq: Every day | ORAL | Status: DC
  Administered 2014-07-21 – 2014-07-22 (×2): 25 mg via ORAL
  Filled 2014-07-21 (×2): qty 1

## 2014-07-21 MED ORDER — CLOPIDOGREL BISULFATE 75 MG PO TABS
300.0000 mg | ORAL_TABLET | Freq: Once | ORAL | Status: AC
  Administered 2014-07-21: 300 mg via ORAL
  Filled 2014-07-21: qty 4

## 2014-07-21 MED ORDER — CARVEDILOL 3.125 MG PO TABS
3.1250 mg | ORAL_TABLET | Freq: Two times a day (BID) | ORAL | Status: DC
  Administered 2014-07-21: 3.125 mg via ORAL
  Filled 2014-07-21: qty 1

## 2014-07-21 MED ORDER — LISINOPRIL 10 MG PO TABS
5.0000 mg | ORAL_TABLET | Freq: Every day | ORAL | Status: DC
  Administered 2014-07-21: 5 mg via ORAL
  Filled 2014-07-21: qty 1

## 2014-07-21 MED ORDER — CARVEDILOL 6.25 MG PO TABS
6.2500 mg | ORAL_TABLET | Freq: Two times a day (BID) | ORAL | Status: DC
  Administered 2014-07-22 (×2): 6.25 mg via ORAL
  Filled 2014-07-21 (×3): qty 1

## 2014-07-21 MED ORDER — HEPARIN BOLUS VIA INFUSION
4000.0000 [IU] | Freq: Once | INTRAVENOUS | Status: AC
  Administered 2014-07-21: 4000 [IU] via INTRAVENOUS
  Filled 2014-07-21: qty 4000

## 2014-07-21 MED ORDER — ASPIRIN EC 81 MG PO TBEC
81.0000 mg | DELAYED_RELEASE_TABLET | Freq: Every day | ORAL | Status: DC
  Administered 2014-07-21: 81 mg via ORAL
  Filled 2014-07-21 (×2): qty 1

## 2014-07-21 NOTE — Care Management Note (Signed)
CARE MANAGEMENT NOTE 07/21/2014  Patient:  LIPA, GLEW   Account Number:  [id number]  Date Initiated:  07/21/2014  Documentation initiated by:  DAVIS,RHONDA  Subjective/Objective Assessment:   chest pain with shortness of breath, elevated troponins, positive drug screen for opiates, and cocaine     Action/Plan:   home when stable   Anticipated DC Date:  07/24/2014   Anticipated DC Plan:  HOME/SELF CARE  In-house referral  NA      DC Planning Services  NA      [facility] Choice  NA   Choice offered to / List presented to:  NA   DME arranged  NA      DME agency  NA     HH arranged  NA      HH agency  NA   Status of service:  In process, will continue to follow Medicare Important Message given?   (If response is "NO", the following Medicare IM given date fields will be blank) Date Medicare IM given:   Medicare IM given by:   Date Additional Medicare IM given:   Additional Medicare IM given by:    Discharge Disposition:    Per UR Regulation:  Reviewed for med. necessity/level of care/duration of stay  If discussed at Long Length of Stay Meetings, dates discussed:    Comments:  July 21, 2014/Rhonda L. Earlene Plater, RN, BSN, CCM. Case Management [facility] [phone] No discharge needs present of time of review.

## 2014-07-21 NOTE — Consult Note (Signed)
Reason for Consult: Chest pain/elevated cardiac enzymes/markedly depressed LV systolic function/cocaine abuse  Referring Physician: [facility]  Larry Mason is an 39 y.o. male.  HPI:. Patient is 39 year old male with past medical history significant for tobacco abuse, cocaine abuse, morbid obesity, positive family history of coronary artery disease, degenerative joint disease, came to the ER complaining of retrosternal chest pain radiating to left arm associated with mild shortness of breath diaphoresis and nausea for last few days off-and-on. Patient also complains of right leg swelling and numbness in left leg. States has been doing cocaine for last 2 -3 years off-and-on IV. States has never shared needles and has been tested for HIV in the past which has been negative. EKG done in the ED showed normal sinus rhythm with LVH with minor ST-T wave changes patient was also noted to have mildly elevated troponin I. 2-D echo showed markedly depressed LV systolic function with EF of 20-25% with questionable apical thrombus. Patient denies any palpitation lightheadedness or syncope.  Past Medical History  Diagnosis Date  . Chronic kidney disease 1984    Past Surgical History  Procedure Laterality Date  . Knee surgery      Family History  Problem Relation Age of Onset  . Diabetes Mellitus II Mother   . Hypertension Father     Social History:  reports that he has been smoking Cigarettes.  He does not have any smokeless tobacco history on file. He reports that he uses illicit drugs. He reports that he does not drink alcohol.  Allergies: No Known Allergies  Medications: I have reviewed the patient's current medications.  Results for orders placed or performed during the hospital encounter of 07/20/14 (from the past 48 hour(s))  Urine rapid drug screen (hosp performed)     Status: Abnormal   Collection Time: 07/20/14  7:13 AM  Result Value Ref Range   Opiates POSITIVE (A) NONE  DETECTED   Cocaine POSITIVE (A) NONE DETECTED   Benzodiazepines NONE DETECTED NONE DETECTED   Amphetamines NONE DETECTED NONE DETECTED   Tetrahydrocannabinol NONE DETECTED NONE DETECTED   Barbiturates NONE DETECTED NONE DETECTED    Comment:        DRUG SCREEN FOR MEDICAL PURPOSES ONLY.  IF CONFIRMATION IS NEEDED FOR ANY PURPOSE, NOTIFY LAB WITHIN 5 DAYS.        LOWEST DETECTABLE LIMITS FOR URINE DRUG SCREEN Drug Class       Cutoff (ng/mL) Amphetamine      1000 Barbiturate      200 Benzodiazepine   093 Tricyclics       818 Opiates          300 Cocaine          300 THC              50   Troponin I     Status: Abnormal   Collection Time: 07/20/14  8:13 AM  Result Value Ref Range   Troponin I 0.14 (H) <0.031 ng/mL    Comment:        PERSISTENTLY INCREASED TROPONIN VALUES IN THE RANGE OF 0.04-0.49 ng/mL CAN BE SEEN IN:       -UNSTABLE ANGINA       -CONGESTIVE HEART FAILURE       -MYOCARDITIS       -CHEST TRAUMA       -ARRYHTHMIAS       -LATE PRESENTING MYOCARDIAL INFARCTION       -COPD   CLINICAL FOLLOW-UP RECOMMENDED.  Acetaminophen level     Status: Abnormal   Collection Time: 07/20/14  8:14 AM  Result Value Ref Range   Acetaminophen (Tylenol), Serum <10.0 (L) 10 - 30 ug/mL    Comment:        THERAPEUTIC CONCENTRATIONS VARY SIGNIFICANTLY. A RANGE OF 10-30 ug/mL MAY BE AN EFFECTIVE CONCENTRATION FOR MANY PATIENTS. HOWEVER, SOME ARE BEST TREATED AT CONCENTRATIONS OUTSIDE THIS RANGE. ACETAMINOPHEN CONCENTRATIONS >150 ug/mL AT 4 HOURS AFTER INGESTION AND >50 ug/mL AT 12 HOURS AFTER INGESTION ARE OFTEN ASSOCIATED WITH TOXIC REACTIONS.   Salicylate level     Status: None   Collection Time: 07/20/14  8:14 AM  Result Value Ref Range   Salicylate Lvl <7.4 2.8 - 20.0 mg/dL  Brain natriuretic peptide     Status: Abnormal   Collection Time: 07/20/14  8:15 AM  Result Value Ref Range   B Natriuretic Peptide 143.0 (H) 0.0 - 100.0 pg/mL  Culture, blood (routine x 2)      Status: None (Preliminary result)   Collection Time: 07/20/14  9:35 AM  Result Value Ref Range   Specimen Description BLOOD LAC    Special Requests BOTTLES DRAWN AEROBIC AND ANAEROBIC 5ML    Culture             BLOOD CULTURE RECEIVED NO GROWTH TO DATE CULTURE WILL BE HELD FOR 5 DAYS BEFORE ISSUING A FINAL NEGATIVE REPORT Performed at [facility]    Report Status PENDING   Culture, blood (routine x 2)     Status: None (Preliminary result)   Collection Time: 07/20/14 10:05 AM  Result Value Ref Range   Specimen Description BLOOD RIGHT HAND    Special Requests BOTTLES DRAWN AEROBIC AND ANAEROBIC 5ML    Culture             BLOOD CULTURE RECEIVED NO GROWTH TO DATE CULTURE WILL BE HELD FOR 5 DAYS BEFORE ISSUING A FINAL NEGATIVE REPORT Performed at [facility]    Report Status PENDING   CBC     Status: Abnormal   Collection Time: 07/20/14  7:22 PM  Result Value Ref Range   WBC 8.4 4.0 - 10.5 K/uL   RBC 4.24 4.22 - 5.81 MIL/uL   Hemoglobin 12.3 (L) 13.0 - 17.0 g/dL   HCT 38.7 (L) 39.0 - 52.0 %   MCV 91.3 78.0 - 100.0 fL   MCH 29.0 26.0 - 34.0 pg   MCHC 31.8 30.0 - 36.0 g/dL   RDW 12.7 11.5 - 15.5 %   Platelets 307 150 - 400 K/uL  Creatinine, serum     Status: Abnormal   Collection Time: 07/20/14  7:22 PM  Result Value Ref Range   Creatinine, Ser 1.04 0.50 - 1.35 mg/dL   GFR calc non Af Amer 89 (L) >90 mL/min   GFR calc Af Amer >90 >90 mL/min    Comment: (NOTE) The eGFR has been calculated using the CKD EPI equation. This calculation has not been validated in all clinical situations. eGFR's persistently <90 mL/min signify possible Chronic Kidney Disease.   Troponin I     Status: Abnormal   Collection Time: 07/20/14  7:22 PM  Result Value Ref Range   Troponin I 0.14 (H) <0.031 ng/mL    Comment:        PERSISTENTLY INCREASED TROPONIN VALUES IN THE RANGE OF 0.04-0.49 ng/mL CAN BE SEEN IN:       -UNSTABLE ANGINA       -CONGESTIVE HEART FAILURE        -  MYOCARDITIS       -CHEST TRAUMA       -ARRYHTHMIAS       -LATE PRESENTING MYOCARDIAL INFARCTION       -COPD   CLINICAL FOLLOW-UP RECOMMENDED.   TSH     Status: None   Collection Time: 07/20/14  7:22 PM  Result Value Ref Range   TSH 1.143 0.350 - 4.500 uIU/mL  Troponin I     Status: Abnormal   Collection Time: 07/21/14 12:18 AM  Result Value Ref Range   Troponin I 0.14 (H) <0.031 ng/mL    Comment:        PERSISTENTLY INCREASED TROPONIN VALUES IN THE RANGE OF 0.04-0.49 ng/mL CAN BE SEEN IN:       -UNSTABLE ANGINA       -CONGESTIVE HEART FAILURE       -MYOCARDITIS       -CHEST TRAUMA       -ARRYHTHMIAS       -LATE PRESENTING MYOCARDIAL INFARCTION       -COPD   CLINICAL FOLLOW-UP RECOMMENDED.   CBC     Status: Abnormal   Collection Time: 07/21/14  7:09 AM  Result Value Ref Range   WBC 7.2 4.0 - 10.5 K/uL   RBC 4.18 (L) 4.22 - 5.81 MIL/uL   Hemoglobin 12.3 (L) 13.0 - 17.0 g/dL   HCT 38.2 (L) 39.0 - 52.0 %   MCV 91.4 78.0 - 100.0 fL   MCH 29.4 26.0 - 34.0 pg   MCHC 32.2 30.0 - 36.0 g/dL   RDW 12.8 11.5 - 15.5 %   Platelets 315 150 - 400 K/uL  Comprehensive metabolic panel     Status: Abnormal   Collection Time: 07/21/14  7:09 AM  Result Value Ref Range   Sodium 136 135 - 145 mmol/L   Potassium 3.6 3.5 - 5.1 mmol/L   Chloride 100 96 - 112 mmol/L   CO2 28 19 - 32 mmol/L   Glucose, Bld 117 (H) 70 - 99 mg/dL   BUN 15 6 - 23 mg/dL   Creatinine, Ser 0.86 0.50 - 1.35 mg/dL   Calcium 8.5 8.4 - 10.5 mg/dL   Total Protein 6.4 6.0 - 8.3 g/dL   Albumin 3.5 3.5 - 5.2 g/dL   AST 26 0 - 37 U/L   ALT 75 (H) 0 - 53 U/L   Alkaline Phosphatase 47 39 - 117 U/L   Total Bilirubin 0.6 0.3 - 1.2 mg/dL   GFR calc non Af Amer >90 >90 mL/min   GFR calc Af Amer >90 >90 mL/min    Comment: (NOTE) The eGFR has been calculated using the CKD EPI equation. This calculation has not been validated in all clinical situations. eGFR's persistently <90 mL/min signify possible Chronic  Kidney Disease.    Anion gap 8 5 - 15  Troponin I     Status: Abnormal   Collection Time: 07/21/14  7:09 AM  Result Value Ref Range   Troponin I 0.13 (H) <0.031 ng/mL    Comment:        PERSISTENTLY INCREASED TROPONIN VALUES IN THE RANGE OF 0.04-0.49 ng/mL CAN BE SEEN IN:       -UNSTABLE ANGINA       -CONGESTIVE HEART FAILURE       -MYOCARDITIS       -CHEST TRAUMA       -ARRYHTHMIAS       -LATE PRESENTING MYOCARDIAL INFARCTION       -COPD   CLINICAL FOLLOW-UP  RECOMMENDED.     Dg Chest 2 View  07/20/2014   CLINICAL DATA:  Intermittent chest pain for 3-4 weeks, chest pressure and shortness of breath. Lower extremity swelling and numbness.  EXAM: CHEST  2 VIEW  COMPARISON:  None.  FINDINGS: Cardiac silhouette is mildly enlarged, mediastinal silhouette is unremarkable. The lungs are clear without pleural effusions or focal consolidations. Trachea projects midline and there is no pneumothorax. Soft tissue planes and included osseous structures are non-suspicious.  IMPRESSION: Mild cardiomegaly, no acute pulmonary process.   Electronically Signed   By: Elon Alas   On: 07/20/2014 02:40   Ct Angio Chest Pe W/cm &/or Wo Cm  07/20/2014   CLINICAL DATA:  39 year old male with shortness of breath and left chest pain radiating toward the left shoulder. D-dimer elevated. Evaluate for pulmonary embolus.  EXAM: CT ANGIOGRAPHY CHEST WITH CONTRAST  TECHNIQUE: Multidetector CT imaging of the chest was performed using the standard protocol during bolus administration of intravenous contrast. Multiplanar CT image reconstructions and MIPs were obtained to evaluate the vascular anatomy.  CONTRAST:  158m OMNIPAQUE IOHEXOL 350 MG/ML SOLN  COMPARISON:  Chest x-ray obtained earlier today  FINDINGS: Mediastinum: Mildly prominent prevascular lymph node measures 9 mm in short axis. Given the patient's demographics, this is likely reactive. No additional suspicious mediastinal or hilar lymph nodes. No anterior  mediastinal mass. Visualized thyroid gland is within normal limits. Unremarkable thoracic esophagus.  Heart/Vascular: Adequate opacification of the main and lobar pulmonary arteries. Opacification of the segmental and subsegmental vessels is suboptimal. No evidence of central filling defect to suggest pulmonary embolus. Right brachiocephalic and left common carotid artery share a common origin. No aneurysmal dilatation of the ascending aorta. No evidence of dissection. Borderline cardiomegaly with marked left ventricular dilatation. The transverse diameter of the left ventricle measures 80 mm almost twice the expected norm. Although limited by non cardiac gated technique, no definite calcified plaque identified along the course of the coronary arteries.  Lungs/Pleura: No pleural effusion. Calcified granuloma in the periphery of the right upper lobe.  Bones/Soft Tissues: No acute fracture or aggressive appearing lytic or blastic osseous lesion.  Upper Abdomen: Visualized upper abdominal organs are unremarkable.  Review of the MIP images confirms the above findings.  IMPRESSION: 1. Negative for acute pulmonary embolus to the lobar pulmonary arterial level. 2. Cardiomegaly with age advanced left ventricular dilatation and a suggestion of hypokinesis. Primary differential considerations include both ischemic and nonischemic cardiomyopathy. Although this study is slightly limited by non cardiac gated technique, no definite calcified plaque identified along the course of the coronary arteries thus favoring nonischemic cardiomyopathy. Recommend cardiology consultation and echocardiogram for further evaluation.   Electronically Signed   By: HJacqulynn CadetM.D.   On: 07/20/2014 09:06    Review of Systems  Constitutional: Negative for fever and chills.  Eyes: Negative for blurred vision and double vision.  Respiratory: Negative for cough, hemoptysis and sputum production.   Cardiovascular: Positive for chest pain  and leg swelling. Negative for palpitations, orthopnea and claudication.  Gastrointestinal: Negative for nausea, vomiting and abdominal pain.  Genitourinary: Negative for dysuria.  Neurological: Negative for dizziness and headaches.   Blood pressure 162/93, pulse 83, temperature 97.7 F (36.5 C), temperature source Oral, resp. rate 18, height _0  (1.702 m), weight 117.935 kg (260 lb), SpO2 97 %. Physical Exam  Constitutional: He is oriented to person, place, and time.  HENT:  Head: Normocephalic and atraumatic.  Eyes: Conjunctivae are normal. Pupils are equal, round,  and reactive to light. Left eye exhibits no discharge. No scleral icterus.  Neck: Normal range of motion. Neck supple. No JVD present. Tracheal deviation present. No thyromegaly present.  Cardiovascular:  Tachycardic S1 and S2 soft there is soft systolic murmur noted  Respiratory: Effort normal and breath sounds normal. No respiratory distress. He has no wheezes. He has no rales.  GI: Soft. Bowel sounds are normal. He exhibits no distension. There is no tenderness. There is no rebound.  Musculoskeletal: He exhibits no edema or tenderness.  Neurological: He is alert and oriented to person, place, and time.    Assessment/Plan: Status post recent non-Q-wave myocardial infarction Severe dilated cardiomyopathy with questionable LV apical thrombus Cocaine abuse Uncontrolled hypertension Morbid obesity Tobacco abuse Rule out obstructive sleep apnea Degenerative joint disease Plan Increase beta blockers and Ace inhibitors as per orders Check 2-D echo with Definity rule out apical thrombus Discussed with patient regarding cardiac catheterization and possible PTCA stenting its risk and benefits i.e. death MI stroke need for emergency CABG local vascular complications etc. and consents for PCI Switch subcutaneous heparin to IV in view of questionable apical thrombus Add statins and clopidogrel as per orders  Will DC NSAIDs and  tramadol Transfer to [facility] telemetry. Patient scheduled for cardiac catheterization possible PTCA stenting in a.m. Steffani Dionisio N 07/21/2014, 6:30 PM

## 2014-07-21 NOTE — Progress Notes (Addendum)
[facility] PROGRESS NOTE  Larry Mason KKX:[id number] DOB: October 22, 1975 DOA: 07/20/2014 PCP: No primary care provider on file.  Assessment/Plan: #1 chest pain Patient presenting with substernal left chest pain radiating to the left upper extremity. Patient currently denied any chest pain. Patient also with some associated shortness of breath. Patient with a history of polysubstance abuse including cocaine and heroin. CT angiogram of chest was negative for PE. Patient also with a history of tobacco abuse. Cardiac enzymes mildly elevated. EKG with no ischemic signs. 2-D echo with EF of 20-25% with akinesis of the entire anteroseptal myocardium, apical lateral myocardium, basal midinferior myocardium, ?? Apical thrombus. Probable cardiomyopathy. Will check a fasting lipid panel. Will place patient on aspirin, beta blocker, ACE inhibitor. We'll consult with cardiology for further evaluation and management. Patient for probable cardiac catheterization in the morning.  #2 cardiomyopathy Per 2-D echo. EF of 20-25%. ?? Apical thrombus. Cardiac enzymes are mildly elevated. Will place patient on aspirin, beta blocker, ACE inhibitor. Cardiology consultation pending. Patient for probable cardiac catheterization tomorrow.  #3 polysubstance abuse Patient states last use of cocaine was approximately a week prior to admission. Patient was counseled on polysubstance cessation. On admission UDS was not done. Follow.  #4 hypertension Place patient on beta blocker and ACE inhibitor. Follow.  #5 right lower extremity swelling Lower extremity Dopplers negative. Resolved.  #6 prophylaxis Heparin for DVT prophylaxis.  Code Status: Full Family Communication: Updated patient at bedside. Disposition Plan: Remain inpatient   Consultants:  Cardiology: Dr. Sharyn Lull pending  Procedures:  2-D echo 07/20/2014  CT angio chest 07/20/2014  Chest x-ray 07/20/2014  Lower extremity Dopplers negative for  DVT. 07/20/2014  Antibiotics:  None  HPI/Subjective: Patient denies any CP or SOB.  Objective: Filed Vitals:   07/21/14 0600  BP: 153/69  Pulse: 81  Temp: 97.5 F (36.4 C)  Resp: 18    Intake/Output Summary (Last 24 hours) at 07/21/14 0948 Last data filed at 07/20/14 2139  Gross per 24 hour  Intake   1243 ml  Output    240 ml  Net   1003 ml   Filed Weights   07/20/14 1134  Weight: 117.935 kg (260 lb)    Exam:   General:  NAD  Cardiovascular: RRR  Respiratory: CTAB  Abdomen: Soft/NT/ND/+BS  Musculoskeletal: No c/c/e  Data Reviewed: Basic Metabolic Panel:  Recent Labs Lab 07/19/14 2248 07/20/14 1922 07/21/14 0709  NA 137  --  136  K 3.7  --  3.6  CL 102  --  100  CO2 28  --  28  GLUCOSE 133*  --  117*  BUN 17  --  15  CREATININE 0.95 1.04 0.86  CALCIUM 8.8  --  8.5   Liver Function Tests:  Recent Labs Lab 07/21/14 0709  AST 26  ALT 75*  ALKPHOS 47  BILITOT 0.6  PROT 6.4  ALBUMIN 3.5   No results for input(s): LIPASE, AMYLASE in the last 168 hours. No results for input(s): AMMONIA in the last 168 hours. CBC:  Recent Labs Lab 07/19/14 2248 07/20/14 1922 07/21/14 0709  WBC 8.7 8.4 7.2  HGB 12.1* 12.3* 12.3*  HCT 37.9* 38.7* 38.2*  MCV 91.3 91.3 91.4  PLT 309 307 315   Cardiac Enzymes:  Recent Labs Lab 07/20/14 0218 07/20/14 0813 07/20/14 1922 07/21/14 0018 07/21/14 0709  TROPONINI 0.15* 0.14* 0.14* 0.14* 0.13*   BNP (last 3 results)  Recent Labs  07/19/14 2248 07/20/14 0815  BNP 143.1* 143.0*  ProBNP (last 3 results) No results for input(s): PROBNP in the last 8760 hours.  CBG: No results for input(s): GLUCAP in the last 168 hours.  Recent Results (from the past 240 hour(s))  Culture, blood (routine x 2)     Status: None (Preliminary result)   Collection Time: 07/20/14  9:35 AM  Result Value Ref Range Status   Specimen Description BLOOD LAC  Final   Special Requests BOTTLES DRAWN AEROBIC AND ANAEROBIC   Final   Culture   Final           BLOOD CULTURE RECEIVED NO GROWTH TO DATE CULTURE WILL BE HELD FOR 5 DAYS BEFORE ISSUING A FINAL NEGATIVE REPORT Performed at [facility]    Report Status PENDING  Incomplete  Culture, blood (routine x 2)     Status: None (Preliminary result)   Collection Time: 07/20/14 10:05 AM  Result Value Ref Range Status   Specimen Description BLOOD RIGHT HAND  Final   Special Requests BOTTLES DRAWN AEROBIC AND ANAEROBIC  Final   Culture   Final           BLOOD CULTURE RECEIVED NO GROWTH TO DATE CULTURE WILL BE HELD FOR 5 DAYS BEFORE ISSUING A FINAL NEGATIVE REPORT Performed at [facility]    Report Status PENDING  Incomplete     Studies: Dg Chest 2 View  07/20/2014   CLINICAL DATA:  Intermittent chest pain for 3-4 weeks, chest pressure and shortness of breath. Lower extremity swelling and numbness.  EXAM: CHEST  2 VIEW  COMPARISON:  None.  FINDINGS: Cardiac silhouette is mildly enlarged, mediastinal silhouette is unremarkable. The lungs are clear without pleural effusions or focal consolidations. Trachea projects midline and there is no pneumothorax. Soft tissue planes and included osseous structures are non-suspicious.  IMPRESSION: Mild cardiomegaly, no acute pulmonary process.   Electronically Signed   By: Awilda Metro   On: 07/20/2014 02:40   Ct Angio Chest Pe W/cm &/or Wo Cm  07/20/2014   CLINICAL DATA:  39 year old male with shortness of breath and left chest pain radiating toward the left shoulder. D-dimer elevated. Evaluate for pulmonary embolus.  EXAM: CT ANGIOGRAPHY CHEST WITH CONTRAST  TECHNIQUE: Multidetector CT imaging of the chest was performed using the standard protocol during bolus administration of intravenous contrast. Multiplanar CT image reconstructions and MIPs were obtained to evaluate the vascular anatomy.  CONTRAST:  OMNIPAQUE IOHEXOL 350 MG/ML SOLN  COMPARISON:  Chest x-ray obtained earlier today  FINDINGS:  Mediastinum: Mildly prominent prevascular lymph node measures 9 mm in short axis. Given the patient's demographics, this is likely reactive. No additional suspicious mediastinal or hilar lymph nodes. No anterior mediastinal mass. Visualized thyroid gland is within normal limits. Unremarkable thoracic esophagus.  Heart/Vascular: Adequate opacification of the main and lobar pulmonary arteries. Opacification of the segmental and subsegmental vessels is suboptimal. No evidence of central filling defect to suggest pulmonary embolus. Right brachiocephalic and left common carotid artery share a common origin. No aneurysmal dilatation of the ascending aorta. No evidence of dissection. Borderline cardiomegaly with marked left ventricular dilatation. The transverse diameter of the left ventricle measures 80 mm almost twice the expected norm. Although limited by non cardiac gated technique, no definite calcified plaque identified along the course of the coronary arteries.  Lungs/Pleura: No pleural effusion. Calcified granuloma in the periphery of the right upper lobe.  Bones/Soft Tissues: No acute fracture or aggressive appearing lytic or blastic osseous lesion.  Upper Abdomen: Visualized upper  abdominal organs are unremarkable.  Review of the MIP images confirms the above findings.  IMPRESSION: 1. Negative for acute pulmonary embolus to the lobar pulmonary arterial level. 2. Cardiomegaly with age advanced left ventricular dilatation and a suggestion of hypokinesis. Primary differential considerations include both ischemic and nonischemic cardiomyopathy. Although this study is slightly limited by non cardiac gated technique, no definite calcified plaque identified along the course of the coronary arteries thus favoring nonischemic cardiomyopathy. Recommend cardiology consultation and echocardiogram for further evaluation.   Electronically Signed   By: Malachy Moan M.D.   On: 07/20/2014 09:06    Scheduled Meds: .  aspirin EC  81 mg Oral Daily  . carvedilol  3.125 mg Oral BID WC  . heparin  5,000 Units Subcutaneous 3 times per day  . lisinopril  5 mg Oral Daily  . nicotine  21 mg Transdermal Daily  . sodium chloride  3 mL Intravenous Q12H   Continuous Infusions:   Principal Problem:   Chest pain Active Problems:   Elevated troponin   Swelling of right lower extremity   Heroin abuse   Cocaine abuse   SOB (shortness of breath)   Dyspnea    Time spent: 40 mins    [facility] MD [facility] Pager [phone]. If 7PM-7AM, please contact night-coverage at [email or web address], password [facility] 07/21/2014, 9:48 AM  LOS: 1 day

## 2014-07-21 NOTE — Progress Notes (Signed)
ANTICOAGULATION CONSULT NOTE - Initial Consult  Pharmacy Consult for heparin Indication: chest pain/ACS  No Known Allergies  Patient Measurements: Height: 5\' 7"  (170.2 cm) Weight: 260 lb (117.935 kg) IBW/kg (Calculated) : 66.1 Heparin Dosing Weight: 94 kg  Vital Signs: Temp: 97.7 F (36.5 C) (03/09 1500) Temp Source: Oral (03/09 1500) BP: 162/93 mmHg (03/09 1500) Pulse Rate: 83 (03/09 1500)  Labs:  Recent Labs  07/19/14 2248  07/20/14 1922 07/21/14 0018 07/21/14 0709  HGB 12.1*  --  12.3*  --  12.3*  HCT 37.9*  --  38.7*  --  38.2*  PLT 309  --  307  --  315  CREATININE 0.95  --  1.04  --  0.86  TROPONINI 0.13*  < > 0.14* 0.14* 0.13*  < > = values in this interval not displayed.  Estimated Creatinine Clearance: 141.6 mL/min (by C-G formula based on Cr of 0.86).   Medical History: Past Medical History  Diagnosis Date  . Chronic kidney disease 1984    Medications:  Prescriptions prior to admission  Medication Sig Dispense Refill Last Dose  . ibuprofen (ADVIL,MOTRIN) 200 MG tablet Take 800 mg by mouth every 6 (six) hours as needed for moderate pain (pain and swelling).   Past Week at Unknown time   Scheduled:  . aspirin EC  81 mg Oral Daily  . [START ON 07/22/2014] atorvastatin  80 mg Oral q1800  . [START ON 07/22/2014] carvedilol  6.25 mg Oral BID WC  . clopidogrel  300 mg Oral Once  . heparin  4,000 Units Intravenous Once  . [START ON 07/22/2014] lisinopril  10 mg Oral Daily  . nicotine  21 mg Transdermal Daily  . sodium chloride  3 mL Intravenous Q12H  . spironolactone  25 mg Oral Daily   Infusions:  . heparin     PRN: sodium chloride, acetaminophen **OR** acetaminophen, ondansetron **OR** ondansetron (ZOFRAN) IV, polyethylene glycol, sodium chloride, zolpidem  Assessment: 38yoM presents w/ CP radiating to L arm, mild SOB, diaphoresis, and nausea for past few days.  PMH multi-substance abuse including cocaine.  EKG in the ED showed NSR with LVH with  minor ST-T wave changes.  Pt also noted to have mildly elevated TnI. 2-D echo showed markedly depressed LV systolic function with EF of 20-25% with questionable apical thrombus.  Planning transfer to [facility] for PCI.   Baseline CBC with slightly low Hgb, o/w wnl  Baseline aPTT, INR pending  Previously on SQ heparin, last dose today at 3pm  PTA meds: NSAIDs, no anticoagulation  Goal of Therapy:  Heparin level 0.3-0.7 units/ml Monitor platelets by anticoagulation protocol: Yes   Plan:   Heparin 4000 units bolux x1  Heparin 1300 units/hr IV continuous infusion  Check heparin level in 6 hrs  Daily CBC, Daily HL once stable  Monitor for signs of bleeding   Bernadene Person, PharmD Pager: [phone] 07/21/2014, 7:10 PM

## 2014-07-22 ENCOUNTER — Encounter (HOSPITAL_COMMUNITY)
Admission: EM | Disposition: A | Discharge status: Home / Self Care | Payer: Self-pay | Source: Home / Self Care | Attending: Internal Medicine

## 2014-07-22 ENCOUNTER — Encounter (HOSPITAL_COMMUNITY): Payer: Self-pay | Admitting: Cardiology

## 2014-07-22 DIAGNOSIS — I5042 Chronic combined systolic (congestive) and diastolic (congestive) heart failure: Secondary | ICD-10-CM

## 2014-07-22 DIAGNOSIS — E119 Type 2 diabetes mellitus without complications: Secondary | ICD-10-CM

## 2014-07-22 DIAGNOSIS — R0602 Shortness of breath: Secondary | ICD-10-CM

## 2014-07-22 DIAGNOSIS — I5032 Chronic diastolic (congestive) heart failure: Secondary | ICD-10-CM | POA: Insufficient documentation

## 2014-07-22 DIAGNOSIS — R079 Chest pain, unspecified: Secondary | ICD-10-CM | POA: Insufficient documentation

## 2014-07-22 DIAGNOSIS — E1165 Type 2 diabetes mellitus with hyperglycemia: Secondary | ICD-10-CM

## 2014-07-22 HISTORY — PX: LEFT HEART CATHETERIZATION WITH CORONARY ANGIOGRAM: SHX5451

## 2014-07-22 LAB — CBC
HCT: 41.5 % (ref 39.0–52.0)
Hemoglobin: 13.4 g/dL (ref 13.0–17.0)
MCH: 29.5 pg (ref 26.0–34.0)
MCHC: 32.3 g/dL (ref 30.0–36.0)
MCV: 91.2 fL (ref 78.0–100.0)
Platelets: 365 10*3/uL (ref 150–400)
RBC: 4.55 MIL/uL (ref 4.22–5.81)
RDW: 12.8 % (ref 11.5–15.5)
WBC: 11.3 10*3/uL — AB (ref 4.0–10.5)

## 2014-07-22 LAB — HEMOGLOBIN A1C
HEMOGLOBIN A1C: 6.8 % — AB (ref 4.8–5.6)
Mean Plasma Glucose: 148 mg/dL

## 2014-07-22 LAB — POCT ACTIVATED CLOTTING TIME: Activated Clotting Time: 122 seconds

## 2014-07-22 SURGERY — LEFT HEART CATHETERIZATION WITH CORONARY ANGIOGRAM
Anesthesia: LOCAL

## 2014-07-22 MED ORDER — SODIUM CHLORIDE 0.9 % IV SOLN: INTRAVENOUS | Status: AC

## 2014-07-22 MED ORDER — NITROGLYCERIN 1 MG/10 ML FOR IR/CATH LAB
INTRA_ARTERIAL | Status: AC
  Filled 2014-07-22: qty 10

## 2014-07-22 MED ORDER — SPIRONOLACTONE 25 MG PO TABS: 25.0000 mg | ORAL_TABLET | Freq: Every day | ORAL | Status: AC

## 2014-07-22 MED ORDER — SODIUM CHLORIDE 0.9 % IJ SOLN: 3.0000 mL | Freq: Two times a day (BID) | INTRAMUSCULAR | Status: DC

## 2014-07-22 MED ORDER — CARVEDILOL 12.5 MG PO TABS: 12.5000 mg | ORAL_TABLET | Freq: Two times a day (BID) | ORAL | Status: DC

## 2014-07-22 MED ORDER — LIDOCAINE HCL (PF) 1 % IJ SOLN
INTRAMUSCULAR | Status: AC
  Filled 2014-07-22: qty 30

## 2014-07-22 MED ORDER — ATORVASTATIN CALCIUM 40 MG PO TABS: 40.0000 mg | ORAL_TABLET | Freq: Every day | ORAL | Status: DC

## 2014-07-22 MED ORDER — MIDAZOLAM HCL 2 MG/2ML IJ SOLN
INTRAMUSCULAR | Status: AC
  Filled 2014-07-22: qty 2

## 2014-07-22 MED ORDER — PERFLUTREN LIPID MICROSPHERE
1.0000 mL | INTRAVENOUS | Status: AC | PRN
  Administered 2014-07-22: 2 mL via INTRAVENOUS
  Filled 2014-07-22: qty 10

## 2014-07-22 MED ORDER — SODIUM CHLORIDE 0.9 % IV SOLN
INTRAVENOUS | Status: DC
  Administered 2014-07-22: 08:00:00 via INTRAVENOUS

## 2014-07-22 MED ORDER — OXYCODONE-ACETAMINOPHEN 5-325 MG PO TABS: 1.0000 | ORAL_TABLET | ORAL | Status: DC | PRN

## 2014-07-22 MED ORDER — SODIUM CHLORIDE 0.9 % IV SOLN: 250.0000 mL | INTRAVENOUS | Status: DC | PRN

## 2014-07-22 MED ORDER — ASPIRIN 81 MG PO TBEC: 81.0000 mg | DELAYED_RELEASE_TABLET | Freq: Every day | ORAL | Status: DC

## 2014-07-22 MED ORDER — HEPARIN (PORCINE) IN NACL 2-0.9 UNIT/ML-% IJ SOLN
INTRAMUSCULAR | Status: AC
  Filled 2014-07-22: qty 1500

## 2014-07-22 MED ORDER — FENTANYL CITRATE 0.05 MG/ML IJ SOLN
INTRAMUSCULAR | Status: AC
  Filled 2014-07-22: qty 2

## 2014-07-22 MED ORDER — GLIMEPIRIDE 1 MG PO TABS: 1.0000 mg | ORAL_TABLET | Freq: Every day | ORAL | Status: DC

## 2014-07-22 MED ORDER — NICOTINE 21 MG/24HR TD PT24: 21.0000 mg | MEDICATED_PATCH | Freq: Every day | TRANSDERMAL | Status: DC

## 2014-07-22 MED ORDER — SODIUM CHLORIDE 0.9 % IJ SOLN: 3.0000 mL | INTRAMUSCULAR | Status: DC | PRN

## 2014-07-22 MED ORDER — ONDANSETRON HCL 4 MG/2ML IJ SOLN: 4.0000 mg | Freq: Four times a day (QID) | INTRAMUSCULAR | Status: DC | PRN

## 2014-07-22 MED ORDER — ACETAMINOPHEN 325 MG PO TABS: 650.0000 mg | ORAL_TABLET | ORAL | Status: DC | PRN

## 2014-07-22 MED ORDER — ASPIRIN 81 MG PO CHEW
81.0000 mg | CHEWABLE_TABLET | ORAL | Status: AC
  Administered 2014-07-22: 81 mg via ORAL
  Filled 2014-07-22: qty 1

## 2014-07-22 MED ORDER — LISINOPRIL 10 MG PO TABS: 10.0000 mg | ORAL_TABLET | Freq: Every day | ORAL | Status: DC

## 2014-07-22 NOTE — Progress Notes (Signed)
Site area: rt groin Site Prior to Removal:  Level 0 Pressure Applied For:  20 minutes Manual:   yes Patient Status During Pull:  stable Post Pull Site:  Level  0 Post Pull Instructions Given:  yes Post Pull Pulses Present:  yes Dressing Applied:  tegaderm Bedrest begins @ 0920 Comments: 0 complications

## 2014-07-22 NOTE — Progress Notes (Signed)
  Echocardiogram 2D Echocardiogram has been performed.  Larry Mason 07/22/2014, 3:17 PM

## 2014-07-22 NOTE — Discharge Summary (Signed)
Physician Discharge Summary  GAMAL TODISCO ZOX:[id number] DOB: 1976/03/28 DOA: 07/20/2014  PCP: No primary care provider on file.  Admit date: 07/20/2014 Discharge date: 07/22/2014  Time spent: >30 minutes  Recommendations for Outpatient Follow-up:  1.  repeat basic metabolic panel to follow electrolytes and renal function 2.  reassessed patient's blood pressure and adjust medications as needed 3.  Continue titration on heart medications (beta blockers , spironolactone and ACE inhibitor) 4.  repeat lipid panel and LFTs in approximately 3 months with further adjustment 2 patient's statins based on cholesterol level  Discharge Diagnoses:  Principal Problem:   Chest pain Active Problems:   Elevated troponin   Swelling of right lower extremity   Heroin abuse   Cocaine abuse   SOB (shortness of breath)   Dyspnea   Essential hypertension   Diabetes  hyperlipidemia  Discharge Condition:  Stable and improved. Patient has been discharged home with instructions to follow with Dr. Sharyn Lull in 2 weeks  Diet recommendation:  Heart healthy diet and low carbohydrates  Filed Weights   07/20/14 1134 07/22/14 0124  Weight: 117.935 kg (260 lb) 131.316 kg (289 lb 8 oz)    History of present illness:  39 y.o. male no significant past medical history that comes left-sided chest pain and shortness of breath with radiation to the arm left arm. He relates his shortness of breath has progressively gotten worst. He relates it came on suddenly with radiating to his left arm. He relates some shortness of breath with diaphoresis. He also relates that his shortness of breath and chest pain is made worse with exertion. He also relates some swelling of his right foot where he has had a couple of surgeries. And some numbness on his left foot and he denies any fever, nausea, vomiting, diarrhea or syncope, he denies any long plane rides or car rides, also any recent leg surgeries. He does relate a positive history  of IV drug abuse and cocaine.  Hospital Course:  #1 chest pain and elevated troponin - appears to be secondary to demand ischemia in a patient with known ischemic cardiomyopathy - patient has a prior history of cocaine and heroine abuse - CT and U of the chest was negative for PE;  Chest x-ray negative for acute pulmonary process and there was no abnormalities on patient EKG - patient ended having a 2-D echo that demonstrated ejection fraction of 20-25% with global cardiomyopathy - left heart cath demonstrated clean coronary arteries - patient has been started on medications including carvedilol, lisinopril and spironolactone - close follow-up with cardiology as an outpatient for medication adjustment has been arranged  #2 cardiomyopathy (Non-ischemic):  Most likely secondary to cocaine and heroin abuse -Per 2-D echo. EF of 20-25%.  -no Apical thrombus on repeated echo with contrast -clean CAD on heart cath - patient will be discharged on a spironolactone, lisinopril  and carvedilol - follow-up with Dr. Sharyn Lull for medication titration and repeat echo in approx 3 months;  If ejection fraction is still below 35% by that time patient will require ICD implantation - Patient advised to follow a low-sodium diet and to check his weight on daily basis  #3 polysubstance abuse -Patient With history of cocaine and heroin abuse; states last use of cocaine was approximately a week prior to admission.  -cessation counseling was provided and patient is looking to stop use of any recreational drugs.   #4 hypertension -blood pressure fairly well controlled prior to discharge -Regimen includes spironolactone, carvedilol , lisinopril -  patient advised to follow a low-sodium/heart healthy diet  #5 right lower extremity swelling -Lower extremity Dopplers negative 4 DVT or SVT.  -Swelling has Resolved Prior to discharge -patient advised to follow a low-sodium diet   #6 tobacco abuse -Cessation  counseling has been provided -Patient is looking to quit smoking.   #7 diabetes (type II) - A1c 6.8 - patient has been started on amaryl  Advised to follow low carbohydrates diet   #8 hyperlipidemia: - started on Lipitor 40 mg by mouth daily.   Procedures:  2-D echo 07/20/2014 - Left ventricle: The cavity size was severely dilated. Systolic function was severely reduced. The estimated ejection fraction was in the range of 20% to 25%. There is akinesis of the entireanteroseptal myocardium. There is akinesis of the apicallateral myocardium. There is akinesis of the basal-mid inferior myocardium. Suggestive of apical akinesis and possible mural thrombus but not adequately visualized. Suggest repeat limited study with definity contrast agent. Features are consistent with a pseudonormal left ventricular filling pattern, with concomitant abnormal relaxation and increased filling pressure (grade 2 diastolic dysfunction). - Aortic valve: Trileaflet; normal thickness, mildly calcified leaflets. - Left atrium: The atrium was moderately dilated.   CT angio chest 07/20/2014:  Negative for pulmonary embolism   Chest x-ray 07/20/2014:   Mild cardiomegaly and no acute pulmonary process    Lower extremity Dopplers negative for DVT. 07/20/2014    2-D echo with contrast on 07/22/14 : Limited study to rule out apical thrombus.  - Left ventricle: The cavity size was mildly dilated. Systolic function was severely reduced. The estimated ejection fraction was in the range of 20% to 25%. Wall motion was normal; there were no regional wall motion abnormalities. Acoustic contrast opacification revealed no evidence ofthrombus. - Left atrium: The atrium was mildly dilated. - Atrial septum: No defect or patent foramen ovale was identified.    left cardiac cath with selective left and right coronary angiography  07/22/14:  FINDINGS: LV was not done in view of  questionable apical LV thrombus. Left main was patent. LAD was patent. Diagonal 1 was moderate sized, which was patent. Diagonal 2 was very small, which was patent. Ramus was moderate sized, which was patent. Left circumflex was patent. OM-1 was moderate sized, which was patent. OM-2 was small, which was patent. OM-3 was moderate sized, which was patent. RCA was patent. PDA and PLV branches were patent.   Consultations:   Cardiology     Discharge Exam: Filed Vitals:   07/22/14 1302  BP: 111/87  Pulse: 94  Temp:   Resp:    Neck: no adenopathy, no carotid bruit, no JVD and supple, symmetrical, trachea midline Lungs: clear to auscultation bilaterally Heart: regular rate and rhythm, S1, S2 normal and Soft systolic murmur noted Abdomen: soft, non-tender; bowel sounds normal; no masses, no organomegaly Extremities: extremities normal, atraumatic, no cyanosis or edema and Right groin stable with no evidence of hematoma or bruit  Discharge Instructions   Discharge Instructions    Diet - low sodium heart healthy    Complete by:  As directed      Discharge instructions    Complete by:  As directed   Take medications as prescribed Follow a heart healthy and low carbohydrates diet Low sodium diet (< 2 gram daily) Check your weight on daily basis (please contact Dr. Sharyn Lull with any increase of more than 3 pounds overnight and/or more than 5 pounds in a week)  Stop smoking Please stop use of recreational drugs  Current Discharge Medication List    START taking these medications   Details  aspirin EC 81 MG EC tablet Take 1 tablet (81 mg total) by mouth daily.    atorvastatin (LIPITOR) 40 MG tablet Take 1 tablet (40 mg total) by mouth daily at 6 PM. Qty: 30 tablet, Refills: 1    carvedilol (COREG) 12.5 MG tablet Take 1 tablet (12.5 mg total) by mouth 2 (two) times daily with a meal. Qty: 60 tablet, Refills: 1    glimepiride (AMARYL) 1 MG tablet Take 1 tablet  (1 mg total) by mouth daily with breakfast. Qty: 30 tablet, Refills: 1    lisinopril (PRINIVIL,ZESTRIL) 10 MG tablet Take 1 tablet (10 mg total) by mouth daily. Qty: 30 tablet, Refills: 1    nicotine (NICODERM CQ - DOSED IN MG/24 HOURS) 21 mg/24hr patch Place 1 patch (21 mg total) onto the skin daily. Qty: 28 patch, Refills: 0    spironolactone (ALDACTONE) 25 MG tablet Take 1 tablet (25 mg total) by mouth daily. Qty: 30 tablet, Refills: 1      CONTINUE these medications which have NOT CHANGED   Details  ibuprofen (ADVIL,MOTRIN) 200 MG tablet Take 800 mg by mouth every 6 (six) hours as needed for moderate pain (pain and swelling).       No Known Allergies Follow-up Information    Follow up with Robynn Pane, MD. Schedule an appointment as soon as possible for a visit in 2 weeks.   Specialty:  Cardiology   Contact information:   [address] [place] [address] [phone]       The results of significant diagnostics from this hospitalization (including imaging, microbiology, ancillary and laboratory) are listed below for reference.    Significant Diagnostic Studies: Dg Chest 2 View  07/20/2014   CLINICAL DATA:  Intermittent chest pain for 3-4 weeks, chest pressure and shortness of breath. Lower extremity swelling and numbness.  EXAM: CHEST  2 VIEW  COMPARISON:  None.  FINDINGS: Cardiac silhouette is mildly enlarged, mediastinal silhouette is unremarkable. The lungs are clear without pleural effusions or focal consolidations. Trachea projects midline and there is no pneumothorax. Soft tissue planes and included osseous structures are non-suspicious.  IMPRESSION: Mild cardiomegaly, no acute pulmonary process.   Electronically Signed   By: Awilda Metro   On: 07/20/2014 02:40   Ct Angio Chest Pe W/cm &/or Wo Cm  07/20/2014   CLINICAL DATA:  39 year old male with shortness of breath and left chest pain radiating toward the left shoulder. D-dimer elevated.  Evaluate for pulmonary embolus.  EXAM: CT ANGIOGRAPHY CHEST WITH CONTRAST  TECHNIQUE: Multidetector CT imaging of the chest was performed using the standard protocol during bolus administration of intravenous contrast. Multiplanar CT image reconstructions and MIPs were obtained to evaluate the vascular anatomy.  CONTRAST:  OMNIPAQUE IOHEXOL 350 MG/ML SOLN  COMPARISON:  Chest x-ray obtained earlier today  FINDINGS: Mediastinum: Mildly prominent prevascular lymph node measures 9 mm in short axis. Given the patient's demographics, this is likely reactive. No additional suspicious mediastinal or hilar lymph nodes. No anterior mediastinal mass. Visualized thyroid gland is within normal limits. Unremarkable thoracic esophagus.  Heart/Vascular: Adequate opacification of the main and lobar pulmonary arteries. Opacification of the segmental and subsegmental vessels is suboptimal. No evidence of central filling defect to suggest pulmonary embolus. Right brachiocephalic and left common carotid artery share a common origin. No aneurysmal dilatation of the ascending aorta. No evidence of dissection. Borderline cardiomegaly with marked left  ventricular dilatation. The transverse diameter of the left ventricle measures 80 mm almost twice the expected norm. Although limited by non cardiac gated technique, no definite calcified plaque identified along the course of the coronary arteries.  Lungs/Pleura: No pleural effusion. Calcified granuloma in the periphery of the right upper lobe.  Bones/Soft Tissues: No acute fracture or aggressive appearing lytic or blastic osseous lesion.  Upper Abdomen: Visualized upper abdominal organs are unremarkable.  Review of the MIP images confirms the above findings.  IMPRESSION: 1. Negative for acute pulmonary embolus to the lobar pulmonary arterial level. 2. Cardiomegaly with age advanced left ventricular dilatation and a suggestion of hypokinesis. Primary differential considerations include  both ischemic and nonischemic cardiomyopathy. Although this study is slightly limited by non cardiac gated technique, no definite calcified plaque identified along the course of the coronary arteries thus favoring nonischemic cardiomyopathy. Recommend cardiology consultation and echocardiogram for further evaluation.   Electronically Signed   By: Malachy Moan M.D.   On: 07/20/2014 09:06    Microbiology: Recent Results (from the past 240 hour(s))  Culture, blood (routine x 2)     Status: None (Preliminary result)   Collection Time: 07/20/14  9:35 AM  Result Value Ref Range Status   Specimen Description BLOOD LAC  Final   Special Requests BOTTLES DRAWN AEROBIC AND ANAEROBIC  Final   Culture   Final           BLOOD CULTURE RECEIVED NO GROWTH TO DATE CULTURE WILL BE HELD FOR 5 DAYS BEFORE ISSUING A FINAL NEGATIVE REPORT Performed at [facility]    Report Status PENDING  Incomplete  Culture, blood (routine x 2)     Status: None (Preliminary result)   Collection Time: 07/20/14 10:05 AM  Result Value Ref Range Status   Specimen Description BLOOD RIGHT HAND  Final   Special Requests BOTTLES DRAWN AEROBIC AND ANAEROBIC  Final   Culture   Final           BLOOD CULTURE RECEIVED NO GROWTH TO DATE CULTURE WILL BE HELD FOR 5 DAYS BEFORE ISSUING A FINAL NEGATIVE REPORT Performed at [facility]    Report Status PENDING  Incomplete     Labs: Basic Metabolic Panel:  Recent Labs Lab 07/19/14 2248 07/20/14 1922 07/21/14 0709  NA 137  --  136  K 3.7  --  3.6  CL 102  --  100  CO2 28  --  28  GLUCOSE 133*  --  117*  BUN 17  --  15  CREATININE 0.95 1.04 0.86  CALCIUM 8.8  --  8.5   Liver Function Tests:  Recent Labs Lab 07/21/14 0709  AST 26  ALT 75*  ALKPHOS 47  BILITOT 0.6  PROT 6.4  ALBUMIN 3.5   CBC:  Recent Labs Lab 07/19/14 2248 07/20/14 1922 07/21/14 0709 07/22/14 0541  WBC 8.7 8.4 7.2 11.3*  HGB 12.1* 12.3* 12.3* 13.4  HCT 37.9* 38.7*  38.2* 41.5  MCV 91.3 91.3 91.4 91.2  PLT 309 307 315 365   Cardiac Enzymes:  Recent Labs Lab 07/20/14 0218 07/20/14 0813 07/20/14 1922 07/21/14 0018 07/21/14 0709  TROPONINI 0.15* 0.14* 0.14* 0.14* 0.13*   BNP: BNP (last 3 results)  Recent Labs  07/19/14 2248 07/20/14 0815  BNP 143.1* 143.0*    Signed:  Vassie Loll  [facility] 07/22/2014, 6:09 PM

## 2014-07-22 NOTE — CV Procedure (Signed)
Left cardiac cath report dictated on 07/22/2014 dictation number is [id number]

## 2014-07-22 NOTE — Cardiovascular Report (Signed)
NAMEMarland Kitchen  Larry Mason, Larry Mason NO.:  [id number]  MEDICAL RECORD NO.:  [id number]  LOCATION:  2W01C                        FACILITY:  [facility]  PHYSICIAN:  Larry Mason, M.D. DATE OF BIRTH:  06-25-1975  DATE OF PROCEDURE:  07/22/2014 DATE OF DISCHARGE:                           CARDIAC CATHETERIZATION   PROCEDURE:  Left cardiac cath with selective left and right coronary angiography via right groin using Judkins technique.  INDICATION FOR THE PROCEDURE:  Larry Mason is a 39 year old male with no significant past medical history except for tobacco abuse, history of cocaine abuse, morbid obesity, positive family history of coronary artery disease, degenerative joint disease who came to the ER complaining of retrosternal chest pain radiating to the left arm associated with mild shortness of breath, diaphoresis, and nausea for the last few days off and on.  The patient also complains of right leg swelling and numbness in the left leg.  States has been doing cocaine for last 2 to 3 years off and on.  Has used IV cocaine, last cocaine use was few days ago.  States he never shared needles, has been tested for HIV in the past, which was negative.  EKG done in the ED showed normal sinus rhythm with LVH with minor ST-T wave changes.  The patient was noted to have mildly elevated troponin-I.  A 2D echo showed markedly depressed LV systolic function with EF of 20% to 25% with questionable apical thrombus.  The patient denies any palpitation, lightheadedness, or syncope.  Due to typical anginal chest pain, mildly elevated troponin- I, and markedly depressed LV systolic function, discussed with the patient at length regarding left cath, possible PTCA stenting, its risks and benefits, i.e., death, MI, stroke, need for emergency CABG, local vascular complications, etc., and he consented for PCI.  DESCRIPTION OF PROCEDURE:  After obtaining the informed consent, the patient was brought  to the cath lab and was placed on fluoroscopy table. Right groin was prepped and draped in usual fashion.  1% Xylocaine was used for local anesthesia in the right groin.  With the help of thin wall needle, 5-French arterial sheath was placed.  The sheath was aspirated and flushed.  Next, 5-French left Judkins catheter was advanced over the wire under fluoroscopic guidance up to the ascending aorta.  Wire was pulled out.  The catheter was aspirated and connected to the Manifold.  Catheter was further advanced and engaged into left coronary ostium.  Multiple views of the left system were taken.  Next, the catheter was disengaged and was pulled out over the wire and was replaced with 5-French right Judkins catheter, which was advanced over the wire under fluoroscopic guidance up to the ascending aorta.  Wire was pulled out.  The catheter was aspirated and connected to the Manifold.  Catheter was further advanced and engaged into right coronary ostium.  Multiple views of the right system were taken.  Next, catheter was disengaged and was pulled out over the wire.  Sheaths were aspirated and flushed.  FINDINGS:  LV was not done in view of questionable apical LV thrombus. Left main was patent.  LAD was patent.  Diagonal 1 was moderate sized, which was patent.  Diagonal 2 was very small, which was patent.  Ramus was moderate sized, which was patent.  Left circumflex was patent.  OM-1 was moderate sized, which was patent. OM-2 was small, which was patent.  OM-3 was moderate sized, which was patent.  RCA was patent.  PDA and PLV branches were patent.  The patient tolerated the procedure well.  There were no complications.  The patient was transferred to recovery room in stable condition.     Larry Mason, M.D.     M[facility]/MEDQ  D:  07/22/2014  T:  07/22/2014  Job:  [id number]  cc:   Larry Mason, M.D.

## 2014-07-22 NOTE — Interval H&P Note (Signed)
Cath Lab Visit (complete for each Cath Lab visit)  Clinical Evaluation Leading to the Procedure:   ACS: Yes.    Non-ACS:    Anginal Classification: CCS IV  Anti-ischemic medical therapy: Maximal Therapy (2 or more classes of medications)  Non-Invasive Test Results: No non-invasive testing performed  Prior CABG: No previous CABG      History and Physical Interval Note:  07/22/2014 8:16 AM  Larry Mason  has presented today for surgery, with the diagnosis of CP  The various methods of treatment have been discussed with the patient and family. After consideration of risks, benefits and other options for treatment, the patient has consented to  Procedure(s): LEFT HEART CATHETERIZATION WITH CORONARY ANGIOGRAM (N/A) as a surgical intervention .  The patient's history has been reviewed, patient examined, no change in status, stable for surgery.  I have reviewed the patient's chart and labs.  Questions were answered to the patient's satisfaction.     Robynn Pane

## 2014-07-22 NOTE — H&P (View-Only) (Signed)
Reason for Consult: Chest pain/elevated cardiac enzymes/markedly depressed LV systolic function/cocaine abuse  Referring Physician: [facility] hospital[facility]  Larry Mason is an 39 y.o. male.  HPI:. Patient is 39 year old male with past medical history significant for tobacco abuse, cocaine abuse, morbid obesity, positive family history of coronary artery disease, degenerative joint disease, came to the ER complaining of retrosternal chest pain radiating to left arm associated with mild shortness of breath diaphoresis and nausea for last few days off-and-on. Patient also complains of right leg swelling and numbness in left leg. States has been doing cocaine for last 2 -3 years off-and-on IV. States has never shared needles and has been tested for HIV in the past which has been negative. EKG done in the ED showed normal sinus rhythm with LVH with minor ST-T wave changes patient was also noted to have mildly elevated troponin I. 2-D echo showed markedly depressed LV systolic function with EF of 20-25% with questionable apical thrombus. Patient denies any palpitation lightheadedness or syncope.  Past Medical History  Diagnosis Date  . Chronic kidney disease 1984    Past Surgical History  Procedure Laterality Date  . Knee surgery      Family History  Problem Relation Age of Onset  . Diabetes Mellitus II Mother   . Hypertension Father     Social History:  reports that he has been smoking Cigarettes.  He does not have any smokeless tobacco history on file. He reports that he uses illicit drugs. He reports that he does not drink alcohol.  Allergies: No Known Allergies  Medications: I have reviewed the patient's current medications.  Results for orders placed or performed during the hospital encounter of 07/20/14 (from the past 48 hour(s))  Urine rapid drug screen (hosp performed)     Status: Abnormal   Collection Time: 07/20/14  7:13 AM  Result Value Ref Range   Opiates POSITIVE (A) NONE  DETECTED   Cocaine POSITIVE (A) NONE DETECTED   Benzodiazepines NONE DETECTED NONE DETECTED   Amphetamines NONE DETECTED NONE DETECTED   Tetrahydrocannabinol NONE DETECTED NONE DETECTED   Barbiturates NONE DETECTED NONE DETECTED    Comment:        DRUG SCREEN FOR MEDICAL PURPOSES ONLY.  IF CONFIRMATION IS NEEDED FOR ANY PURPOSE, NOTIFY LAB WITHIN 5 DAYS.        LOWEST DETECTABLE LIMITS FOR URINE DRUG SCREEN Drug Class       Cutoff (ng/mL) Amphetamine      1000 Barbiturate      200 Benzodiazepine   093 Tricyclics       818 Opiates          300 Cocaine          300 THC              50   Troponin I     Status: Abnormal   Collection Time: 07/20/14  8:13 AM  Result Value Ref Range   Troponin I 0.14 (H) <0.031 ng/mL    Comment:        PERSISTENTLY INCREASED TROPONIN VALUES IN THE RANGE OF 0.04-0.49 ng/mL CAN BE SEEN IN:       -UNSTABLE ANGINA       -CONGESTIVE HEART FAILURE       -MYOCARDITIS       -CHEST TRAUMA       -ARRYHTHMIAS       -LATE PRESENTING MYOCARDIAL INFARCTION       -COPD   CLINICAL FOLLOW-UP RECOMMENDED.  Acetaminophen level     Status: Abnormal   Collection Time: 07/20/14  8:14 AM  Result Value Ref Range   Acetaminophen (Tylenol), Serum <10.0 (L) 10 - 30 ug/mL    Comment:        THERAPEUTIC CONCENTRATIONS VARY SIGNIFICANTLY. A RANGE OF 10-30 ug/mL MAY BE AN EFFECTIVE CONCENTRATION FOR MANY PATIENTS. HOWEVER, SOME ARE BEST TREATED AT CONCENTRATIONS OUTSIDE THIS RANGE. ACETAMINOPHEN CONCENTRATIONS >150 ug/mL AT 4 HOURS AFTER INGESTION AND >50 ug/mL AT 12 HOURS AFTER INGESTION ARE OFTEN ASSOCIATED WITH TOXIC REACTIONS.   Salicylate level     Status: None   Collection Time: 07/20/14  8:14 AM  Result Value Ref Range   Salicylate Lvl <7.4 2.8 - 20.0 mg/dL  Brain natriuretic peptide     Status: Abnormal   Collection Time: 07/20/14  8:15 AM  Result Value Ref Range   B Natriuretic Peptide 143.0 (H) 0.0 - 100.0 pg/mL  Culture, blood (routine x 2)      Status: None (Preliminary result)   Collection Time: 07/20/14  9:35 AM  Result Value Ref Range   Specimen Description BLOOD LAC    Special Requests BOTTLES DRAWN AEROBIC AND ANAEROBIC 5ML    Culture             BLOOD CULTURE RECEIVED NO GROWTH TO DATE CULTURE WILL BE HELD FOR 5 DAYS BEFORE ISSUING A FINAL NEGATIVE REPORT Performed at [facility]    Report Status PENDING   Culture, blood (routine x 2)     Status: None (Preliminary result)   Collection Time: 07/20/14 10:05 AM  Result Value Ref Range   Specimen Description BLOOD RIGHT HAND    Special Requests BOTTLES DRAWN AEROBIC AND ANAEROBIC 5ML    Culture             BLOOD CULTURE RECEIVED NO GROWTH TO DATE CULTURE WILL BE HELD FOR 5 DAYS BEFORE ISSUING A FINAL NEGATIVE REPORT Performed at [facility]    Report Status PENDING   CBC     Status: Abnormal   Collection Time: 07/20/14  7:22 PM  Result Value Ref Range   WBC 8.4 4.0 - 10.5 K/uL   RBC 4.24 4.22 - 5.81 MIL/uL   Hemoglobin 12.3 (L) 13.0 - 17.0 g/dL   HCT 38.7 (L) 39.0 - 52.0 %   MCV 91.3 78.0 - 100.0 fL   MCH 29.0 26.0 - 34.0 pg   MCHC 31.8 30.0 - 36.0 g/dL   RDW 12.7 11.5 - 15.5 %   Platelets 307 150 - 400 K/uL  Creatinine, serum     Status: Abnormal   Collection Time: 07/20/14  7:22 PM  Result Value Ref Range   Creatinine, Ser 1.04 0.50 - 1.35 mg/dL   GFR calc non Af Amer 89 (L) >90 mL/min   GFR calc Af Amer >90 >90 mL/min    Comment: (NOTE) The eGFR has been calculated using the CKD EPI equation. This calculation has not been validated in all clinical situations. eGFR's persistently <90 mL/min signify possible Chronic Kidney Disease.   Troponin I     Status: Abnormal   Collection Time: 07/20/14  7:22 PM  Result Value Ref Range   Troponin I 0.14 (H) <0.031 ng/mL    Comment:        PERSISTENTLY INCREASED TROPONIN VALUES IN THE RANGE OF 0.04-0.49 ng/mL CAN BE SEEN IN:       -UNSTABLE ANGINA       -CONGESTIVE HEART FAILURE        -  MYOCARDITIS       -CHEST TRAUMA       -ARRYHTHMIAS       -LATE PRESENTING MYOCARDIAL INFARCTION       -COPD   CLINICAL FOLLOW-UP RECOMMENDED.   TSH     Status: None   Collection Time: 07/20/14  7:22 PM  Result Value Ref Range   TSH 1.143 0.350 - 4.500 uIU/mL  Troponin I     Status: Abnormal   Collection Time: 07/21/14 12:18 AM  Result Value Ref Range   Troponin I 0.14 (H) <0.031 ng/mL    Comment:        PERSISTENTLY INCREASED TROPONIN VALUES IN THE RANGE OF 0.04-0.49 ng/mL CAN BE SEEN IN:       -UNSTABLE ANGINA       -CONGESTIVE HEART FAILURE       -MYOCARDITIS       -CHEST TRAUMA       -ARRYHTHMIAS       -LATE PRESENTING MYOCARDIAL INFARCTION       -COPD   CLINICAL FOLLOW-UP RECOMMENDED.   CBC     Status: Abnormal   Collection Time: 07/21/14  7:09 AM  Result Value Ref Range   WBC 7.2 4.0 - 10.5 K/uL   RBC 4.18 (L) 4.22 - 5.81 MIL/uL   Hemoglobin 12.3 (L) 13.0 - 17.0 g/dL   HCT 38.2 (L) 39.0 - 52.0 %   MCV 91.4 78.0 - 100.0 fL   MCH 29.4 26.0 - 34.0 pg   MCHC 32.2 30.0 - 36.0 g/dL   RDW 12.8 11.5 - 15.5 %   Platelets 315 150 - 400 K/uL  Comprehensive metabolic panel     Status: Abnormal   Collection Time: 07/21/14  7:09 AM  Result Value Ref Range   Sodium 136 135 - 145 mmol/L   Potassium 3.6 3.5 - 5.1 mmol/L   Chloride 100 96 - 112 mmol/L   CO2 28 19 - 32 mmol/L   Glucose, Bld 117 (H) 70 - 99 mg/dL   BUN 15 6 - 23 mg/dL   Creatinine, Ser 0.86 0.50 - 1.35 mg/dL   Calcium 8.5 8.4 - 10.5 mg/dL   Total Protein 6.4 6.0 - 8.3 g/dL   Albumin 3.5 3.5 - 5.2 g/dL   AST 26 0 - 37 U/L   ALT 75 (H) 0 - 53 U/L   Alkaline Phosphatase 47 39 - 117 U/L   Total Bilirubin 0.6 0.3 - 1.2 mg/dL   GFR calc non Af Amer >90 >90 mL/min   GFR calc Af Amer >90 >90 mL/min    Comment: (NOTE) The eGFR has been calculated using the CKD EPI equation. This calculation has not been validated in all clinical situations. eGFR's persistently <90 mL/min signify possible Chronic  Kidney Disease.    Anion gap 8 5 - 15  Troponin I     Status: Abnormal   Collection Time: 07/21/14  7:09 AM  Result Value Ref Range   Troponin I 0.13 (H) <0.031 ng/mL    Comment:        PERSISTENTLY INCREASED TROPONIN VALUES IN THE RANGE OF 0.04-0.49 ng/mL CAN BE SEEN IN:       -UNSTABLE ANGINA       -CONGESTIVE HEART FAILURE       -MYOCARDITIS       -CHEST TRAUMA       -ARRYHTHMIAS       -LATE PRESENTING MYOCARDIAL INFARCTION       -COPD   CLINICAL FOLLOW-UP  RECOMMENDED.     Dg Chest 2 View  07/20/2014   CLINICAL DATA:  Intermittent chest pain for 3-4 weeks, chest pressure and shortness of breath. Lower extremity swelling and numbness.  EXAM: CHEST  2 VIEW  COMPARISON:  None.  FINDINGS: Cardiac silhouette is mildly enlarged, mediastinal silhouette is unremarkable. The lungs are clear without pleural effusions or focal consolidations. Trachea projects midline and there is no pneumothorax. Soft tissue planes and included osseous structures are non-suspicious.  IMPRESSION: Mild cardiomegaly, no acute pulmonary process.   Electronically Signed   By: Elon Alas   On: 07/20/2014 02:40   Ct Angio Chest Pe W/cm &/or Wo Cm  07/20/2014   CLINICAL DATA:  39 year old male with shortness of breath and left chest pain radiating toward the left shoulder. D-dimer elevated. Evaluate for pulmonary embolus.  EXAM: CT ANGIOGRAPHY CHEST WITH CONTRAST  TECHNIQUE: Multidetector CT imaging of the chest was performed using the standard protocol during bolus administration of intravenous contrast. Multiplanar CT image reconstructions and MIPs were obtained to evaluate the vascular anatomy.  CONTRAST:  158m OMNIPAQUE IOHEXOL 350 MG/ML SOLN  COMPARISON:  Chest x-ray obtained earlier today  FINDINGS: Mediastinum: Mildly prominent prevascular lymph node measures 9 mm in short axis. Given the patient's demographics, this is likely reactive. No additional suspicious mediastinal or hilar lymph nodes. No anterior  mediastinal mass. Visualized thyroid gland is within normal limits. Unremarkable thoracic esophagus.  Heart/Vascular: Adequate opacification of the main and lobar pulmonary arteries. Opacification of the segmental and subsegmental vessels is suboptimal. No evidence of central filling defect to suggest pulmonary embolus. Right brachiocephalic and left common carotid artery share a common origin. No aneurysmal dilatation of the ascending aorta. No evidence of dissection. Borderline cardiomegaly with marked left ventricular dilatation. The transverse diameter of the left ventricle measures 80 mm almost twice the expected norm. Although limited by non cardiac gated technique, no definite calcified plaque identified along the course of the coronary arteries.  Lungs/Pleura: No pleural effusion. Calcified granuloma in the periphery of the right upper lobe.  Bones/Soft Tissues: No acute fracture or aggressive appearing lytic or blastic osseous lesion.  Upper Abdomen: Visualized upper abdominal organs are unremarkable.  Review of the MIP images confirms the above findings.  IMPRESSION: 1. Negative for acute pulmonary embolus to the lobar pulmonary arterial level. 2. Cardiomegaly with age advanced left ventricular dilatation and a suggestion of hypokinesis. Primary differential considerations include both ischemic and nonischemic cardiomyopathy. Although this study is slightly limited by non cardiac gated technique, no definite calcified plaque identified along the course of the coronary arteries thus favoring nonischemic cardiomyopathy. Recommend cardiology consultation and echocardiogram for further evaluation.   Electronically Signed   By: HJacqulynn CadetM.D.   On: 07/20/2014 09:06    Review of Systems  Constitutional: Negative for fever and chills.  Eyes: Negative for blurred vision and double vision.  Respiratory: Negative for cough, hemoptysis and sputum production.   Cardiovascular: Positive for chest pain  and leg swelling. Negative for palpitations, orthopnea and claudication.  Gastrointestinal: Negative for nausea, vomiting and abdominal pain.  Genitourinary: Negative for dysuria.  Neurological: Negative for dizziness and headaches.   Blood pressure 162/93, pulse 83, temperature 97.7 F (36.5 C), temperature source Oral, resp. rate 18, height _0  (1.702 m), weight 117.935 kg (260 lb), SpO2 97 %. Physical Exam  Constitutional: He is oriented to person, place, and time.  HENT:  Head: Normocephalic and atraumatic.  Eyes: Conjunctivae are normal. Pupils are equal, round,  and reactive to light. Left eye exhibits no discharge. No scleral icterus.  Neck: Normal range of motion. Neck supple. No JVD present. Tracheal deviation present. No thyromegaly present.  Cardiovascular:  Tachycardic S1 and S2 soft there is soft systolic murmur noted  Respiratory: Effort normal and breath sounds normal. No respiratory distress. He has no wheezes. He has no rales.  GI: Soft. Bowel sounds are normal. He exhibits no distension. There is no tenderness. There is no rebound.  Musculoskeletal: He exhibits no edema or tenderness.  Neurological: He is alert and oriented to person, place, and time.    Assessment/Plan: Status post recent non-Q-wave myocardial infarction Severe dilated cardiomyopathy with questionable LV apical thrombus Cocaine abuse Uncontrolled hypertension Morbid obesity Tobacco abuse Rule out obstructive sleep apnea Degenerative joint disease Plan Increase beta blockers and Ace inhibitors as per orders Check 2-D echo with Definity rule out apical thrombus Discussed with patient regarding cardiac catheterization and possible PTCA stenting its risk and benefits i.e. death MI stroke need for emergency CABG local vascular complications etc. and consents for PCI Switch subcutaneous heparin to IV in view of questionable apical thrombus Add statins and clopidogrel as per orders  Will DC NSAIDs and  tramadol Transfer to [facility] telemetry. Patient scheduled for cardiac catheterization possible PTCA stenting in a.m. Darrell Hauk N 07/21/2014, 6:30 PM

## 2014-07-22 NOTE — Progress Notes (Signed)
Subjective:  Doing well denies any chest pain or shortness of breath and leg swelling completely resolved. Tolerated cardiac cath earlier this morning with no evidence of coronary artery disease. 2-D echo showed no evidence of apical thrombus.  Objective:  Vital Signs in the last 24 hours: Temp:  [97.7 F (36.5 C)-98.6 F (37 C)] 98.2 F (36.8 C) (03/10 0707) Pulse Rate:  [69-104] 94 (03/10 1302) Resp:  [13-26] 20 (03/10 1036) BP: (75-154)/(51-94) 111/87 mmHg (03/10 1302) SpO2:  [88 %-96 %] 94 % (03/10 1302) Weight:  [131.316 kg (289 lb 8 oz)] 131.316 kg (289 lb 8 oz) (03/10 0124)  Intake/Output from previous day: 03/09 0701 - 03/10 0700 In: 480 [P.O.:480] Out: 600 [Urine:600] Intake/Output from this shift: Total I/O In: 480 [P.O.:480] Out: 350 [Urine:350]  Physical Exam: Neck: no adenopathy, no carotid bruit, no JVD and supple, symmetrical, trachea midline Lungs: clear to auscultation bilaterally Heart: regular rate and rhythm, S1, S2 normal and Soft systolic murmur noted Abdomen: soft, non-tender; bowel sounds normal; no masses,  no organomegaly Extremities: extremities normal, atraumatic, no cyanosis or edema and Right groin stable with no evidence of hematoma or bruit  Lab Results:  Recent Labs  07/21/14 0709 07/22/14 0541  WBC 7.2 11.3*  HGB 12.3* 13.4  PLT 315 365    Recent Labs  07/19/14 2248 07/20/14 1922 07/21/14 0709  NA 137  --  136  K 3.7  --  3.6  CL 102  --  100  CO2 28  --  28  GLUCOSE 133*  --  117*  BUN 17  --  15  CREATININE 0.95 1.04 0.86    Recent Labs  07/21/14 0018 07/21/14 0709  TROPONINI 0.14* 0.13*   Hepatic Function Panel  Recent Labs  07/21/14 0709  PROT 6.4  ALBUMIN 3.5  AST 26  ALT 75*  ALKPHOS 47  BILITOT 0.6   No results for input(s): CHOL in the last 72 hours. No results for input(s): PROTIME in the last 72 hours.  Imaging: Imaging results have been reviewed and No results found.  Cardiac  Studies:  Assessment/Plan:  Status post recent non-Q-wave myocardial infarction secondary to vasospasm due to cocaine abuse status post left cath Severe dilated cardiomyopathy  Cocaine abuse Uncontrolled hypertension Morbid obesity Tobacco abuse Rule out obstructive sleep apnea Degenerative joint disease Plan Okay to discharge home on cardiac point of view Will increase ACE inhibitor 7 be a blockers as outpatient as blood pressure tolerates Will repeat 2-D echo in a few months if EF remains below 35% and will need ICD Discussed with patient regarding diet compliance with medication smoking and drugs cessation.  LOS: 2 days    Larry Mason N 07/22/2014, 5:34 PM

## 2014-07-24 NOTE — Progress Notes (Signed)
07/24/2014 4:06 PM NURSING NOTE Received call from pt. Mother, Jamarques Erich, Stating that pt. Did not receive printed rx at discharge. Looked at nurses station as well as doctor dictation room. No evidence of rx at those locations. Dr. Gwenlyn Perking paged and made aware of pt. Preferred pharmacy. MD to call in rx. Pt. Family member contacted and updated on this information.  Travonne Schowalter, Blanchard Kelch

## 2014-07-26 LAB — CULTURE, BLOOD (ROUTINE X 2)
Culture: NO GROWTH
Culture: NO GROWTH

## 2015-02-18 NOTE — Progress Notes (Signed)
     HPI: 39 yo male for evaluation of CHF. Admitted 3/16 with new onset CHF. Echo 3/16 showed EF 20-25, mild LAE. CTA 3/16 showed no pulmonary embolus. Cath 3/16 showed no CAD.   Current Outpatient Prescriptions  Medication Sig Dispense Refill  . aspirin EC 81 MG EC tablet Take 1 tablet (81 mg total) by mouth daily.    Marland Kitchen atorvastatin (LIPITOR) 40 MG tablet Take 1 tablet (40 mg total) by mouth daily at 6 PM. 30 tablet 1  . carvedilol (COREG) 12.5 MG tablet Take 1 tablet (12.5 mg total) by mouth 2 (two) times daily with a meal. 60 tablet 1  . glimepiride (AMARYL) 1 MG tablet Take 1 tablet (1 mg total) by mouth daily with breakfast. 30 tablet 1  . ibuprofen (ADVIL,MOTRIN) 200 MG tablet Take 800 mg by mouth every 6 (six) hours as needed for moderate pain (pain and swelling).    Marland Kitchen lisinopril (PRINIVIL,ZESTRIL) 10 MG tablet Take 1 tablet (10 mg total) by mouth daily. 30 tablet 1  . nicotine (NICODERM CQ - DOSED IN MG/24 HOURS) 21 mg/24hr patch Place 1 patch (21 mg total) onto the skin daily. 28 patch 0  . spironolactone (ALDACTONE) 25 MG tablet Take 1 tablet (25 mg total) by mouth daily. 30 tablet 1   No current facility-administered medications for this visit.    No Known Allergies  Past Medical History  Diagnosis Date  . Chronic kidney disease 1984    Past Surgical History  Procedure Laterality Date  . Knee surgery    . Left heart catheterization with coronary angiogram N/A 07/22/2014    Procedure: LEFT HEART CATHETERIZATION WITH CORONARY ANGIOGRAM;  Surgeon: Rinaldo Cloud, MD;  Location[facility] CATH LAB;  Service: Cardiovascular;  Laterality: N/A;    Social History   Social History  . Marital Status: Married    Spouse Name: N/A  . Number of Children: N/A  . Years of Education: N/A   Occupational History  . Not on file.   Social History Main Topics  . Smoking status: Current Every Day Smoker    Types: Cigarettes  . Smokeless tobacco: Not on file  . Alcohol Use: No  . Drug  Use: Yes     Comment: heroin last used 3 to 4 weeks ago  . Sexual Activity: Yes   Other Topics Concern  . Not on file   Social History Narrative    Family History  Problem Relation Age of Onset  . Diabetes Mellitus II Mother   . Hypertension Father     ROS: no fevers or chills, productive cough, hemoptysis, dysphasia, odynophagia, melena, hematochezia, dysuria, hematuria, rash, seizure activity, orthopnea, PND, pedal edema, claudication. Remaining systems are negative.  Physical Exam:   There were no vitals taken for this visit.  General:  Well developed/well nourished in NAD Skin warm/dry Patient not depressed No peripheral clubbing Back-normal HEENT-normal/normal eyelids Neck supple/normal carotid upstroke bilaterally; no bruits; no JVD; no thyromegaly chest - CTA/ normal expansion CV - RRR/normal S1 and S2; no murmurs, rubs or gallops;  PMI nondisplaced Abdomen -NT/ND, no HSM, no mass, + bowel sounds, no bruit 2+ femoral pulses, no bruits Ext-no edema, chords, 2+ DP Neuro-grossly nonfocal  ECG    This encounter was created in error - please disregard.

## 2015-02-22 ENCOUNTER — Encounter: Payer: Managed Care, Other (non HMO) | Admitting: Cardiology

## 2015-02-23 ENCOUNTER — Ambulatory Visit: Payer: Managed Care, Other (non HMO) | Admitting: Cardiology

## 2015-03-07 NOTE — Progress Notes (Signed)
     HPI: 39 year old male for evaluation of coronary artery disease. Note h/o substance abuse. Admitted with chest pain March 2016. Echocardiogram showed an ejection fraction of 20-25%. Grade 2 diastolic dysfunction. Moderate left atrial enlargement. Follow-up study with contrast showed no thrombus. Cardiac catheterization was performed by Dr. Sharyn Lull and showed no coronary disease.  Current Outpatient Prescriptions  Medication Sig Dispense Refill  . aspirin EC 81 MG EC tablet Take 1 tablet (81 mg total) by mouth daily.    Marland Kitchen atorvastatin (LIPITOR) 40 MG tablet Take 1 tablet (40 mg total) by mouth daily at 6 PM. 30 tablet 1  . carvedilol (COREG) 12.5 MG tablet Take 1 tablet (12.5 mg total) by mouth 2 (two) times daily with a meal. 60 tablet 1  . glimepiride (AMARYL) 1 MG tablet Take 1 tablet (1 mg total) by mouth daily with breakfast. 30 tablet 1  . ibuprofen (ADVIL,MOTRIN) 200 MG tablet Take 800 mg by mouth every 6 (six) hours as needed for moderate pain (pain and swelling).    Marland Kitchen lisinopril (PRINIVIL,ZESTRIL) 10 MG tablet Take 1 tablet (10 mg total) by mouth daily. 30 tablet 1  . nicotine (NICODERM CQ - DOSED IN MG/24 HOURS) 21 mg/24hr patch Place 1 patch (21 mg total) onto the skin daily. 28 patch 0  . spironolactone (ALDACTONE) 25 MG tablet Take 1 tablet (25 mg total) by mouth daily. 30 tablet 1   No current facility-administered medications for this visit.    No Known Allergies   Past Medical History  Diagnosis Date  . Chronic kidney disease 1984    Past Surgical History  Procedure Laterality Date  . Knee surgery    . Left heart catheterization with coronary angiogram N/A 07/22/2014    Procedure: LEFT HEART CATHETERIZATION WITH CORONARY ANGIOGRAM;  Surgeon: Rinaldo Cloud, MD;  Location: [facility] CATH LAB;  Service: Cardiovascular;  Laterality: N/A;    Social History   Social History  . Marital Status: Married    Spouse Name: N/A  . Number of Children: N/A  . Years of Education:  N/A   Occupational History  . Not on file.   Social History Main Topics  . Smoking status: Current Every Day Smoker    Types: Cigarettes  . Smokeless tobacco: Not on file  . Alcohol Use: No  . Drug Use: Yes     Comment: heroin last used 3 to 4 weeks ago  . Sexual Activity: Yes   Other Topics Concern  . Not on file   Social History Narrative    Family History  Problem Relation Age of Onset  . Diabetes Mellitus II Mother   . Hypertension Father     ROS: no fevers or chills, productive cough, hemoptysis, dysphasia, odynophagia, melena, hematochezia, dysuria, hematuria, rash, seizure activity, orthopnea, PND, pedal edema, claudication. Remaining systems are negative.  Physical Exam:   There were no vitals taken for this visit.  General:  Well developed/well nourished in NAD Skin warm/dry Patient not depressed No peripheral clubbing Back-normal HEENT-normal/normal eyelids Neck supple/normal carotid upstroke bilaterally; no bruits; no JVD; no thyromegaly chest - CTA/ normal expansion CV - RRR/normal S1 and S2; no murmurs, rubs or gallops;  PMI nondisplaced Abdomen -NT/ND, no HSM, no mass, + bowel sounds, no bruit 2+ femoral pulses, no bruits Ext-no edema, chords, 2+ DP Neuro-grossly nonfocal  ECG    This encounter was created in error - please disregard.

## 2015-03-08 ENCOUNTER — Encounter: Payer: Managed Care, Other (non HMO) | Admitting: Cardiology

## 2015-07-25 ENCOUNTER — Encounter (HOSPITAL_COMMUNITY): Payer: Self-pay | Admitting: Emergency Medicine

## 2015-07-25 ENCOUNTER — Emergency Department (HOSPITAL_COMMUNITY)
Admission: EM | Admit: 2015-07-25 | Discharge: 2015-07-25 | Disposition: A | Discharge status: Home / Self Care | Payer: Managed Care, Other (non HMO) | Attending: Emergency Medicine | Admitting: Emergency Medicine

## 2015-07-25 DIAGNOSIS — Z7982 Long term (current) use of aspirin: Secondary | ICD-10-CM | POA: Insufficient documentation

## 2015-07-25 DIAGNOSIS — Z9889 Other specified postprocedural states: Secondary | ICD-10-CM | POA: Insufficient documentation

## 2015-07-25 DIAGNOSIS — F17210 Nicotine dependence, cigarettes, uncomplicated: Secondary | ICD-10-CM | POA: Insufficient documentation

## 2015-07-25 DIAGNOSIS — N189 Chronic kidney disease, unspecified: Secondary | ICD-10-CM | POA: Insufficient documentation

## 2015-07-25 DIAGNOSIS — Z79899 Other long term (current) drug therapy: Secondary | ICD-10-CM | POA: Insufficient documentation

## 2015-07-25 DIAGNOSIS — L03114 Cellulitis of left upper limb: Secondary | ICD-10-CM | POA: Insufficient documentation

## 2015-07-25 LAB — COMPREHENSIVE METABOLIC PANEL
ALT: 15 U/L — ABNORMAL LOW (ref 17–63)
AST: 19 U/L (ref 15–41)
Albumin: 3.7 g/dL (ref 3.5–5.0)
Alkaline Phosphatase: 57 U/L (ref 38–126)
Anion gap: 10 (ref 5–15)
BILIRUBIN TOTAL: 0.7 mg/dL (ref 0.3–1.2)
BUN: 13 mg/dL (ref 6–20)
CO2: 24 mmol/L (ref 22–32)
Calcium: 9 mg/dL (ref 8.9–10.3)
Chloride: 102 mmol/L (ref 101–111)
Creatinine, Ser: 1.3 mg/dL — ABNORMAL HIGH (ref 0.61–1.24)
GFR calc Af Amer: >60 mL/min (ref >60)
Glucose, Bld: 192 mg/dL — ABNORMAL HIGH (ref 65–99)
Potassium: 4.3 mmol/L (ref 3.5–5.1)
Sodium: 136 mmol/L (ref 135–145)
TOTAL PROTEIN: 8 g/dL (ref 6.5–8.1)

## 2015-07-25 LAB — CBC WITH DIFFERENTIAL/PLATELET
Basophils Absolute: 0 10*3/uL (ref 0.0–0.1)
Basophils Relative: 0 %
EOS PCT: 0 %
Eosinophils Absolute: 0.1 10*3/uL (ref 0.0–0.7)
HEMATOCRIT: 43.1 % (ref 39.0–52.0)
Hemoglobin: 13.9 g/dL (ref 13.0–17.0)
LYMPHS ABS: 1.5 10*3/uL (ref 0.7–4.0)
LYMPHS PCT: 8 %
MCH: 28.2 pg (ref 26.0–34.0)
MCHC: 32.3 g/dL (ref 30.0–36.0)
MCV: 87.4 fL (ref 78.0–100.0)
MONO ABS: 1.4 10*3/uL — AB (ref 0.1–1.0)
MONOS PCT: 7 %
NEUTROS ABS: 16.5 10*3/uL — AB (ref 1.7–7.7)
Neutrophils Relative %: 85 %
PLATELETS: 353 10*3/uL (ref 150–400)
RBC: 4.93 MIL/uL (ref 4.22–5.81)
RDW: 13.7 % (ref 11.5–15.5)
WBC: 19.5 10*3/uL — ABNORMAL HIGH (ref 4.0–10.5)

## 2015-07-25 LAB — I-STAT CG4 LACTIC ACID, ED: Lactic Acid, Venous: 2.06 mmol/L (ref 0.5–2.0)

## 2015-07-25 MED ORDER — ACETAMINOPHEN 325 MG PO TABS
650.0000 mg | ORAL_TABLET | Freq: Once | ORAL | Status: AC
  Administered 2015-07-25: 650 mg via ORAL

## 2015-07-25 MED ORDER — CLINDAMYCIN PHOSPHATE 900 MG/50ML IV SOLN
900.0000 mg | Freq: Once | INTRAVENOUS | Status: AC
  Administered 2015-07-25: 900 mg via INTRAVENOUS
  Filled 2015-07-25: qty 50

## 2015-07-25 MED ORDER — KETOROLAC TROMETHAMINE 30 MG/ML IJ SOLN
30.0000 mg | Freq: Once | INTRAMUSCULAR | Status: AC
  Administered 2015-07-25: 30 mg via INTRAVENOUS
  Filled 2015-07-25: qty 1

## 2015-07-25 MED ORDER — ACETAMINOPHEN 500 MG PO TABS
1000.0000 mg | ORAL_TABLET | Freq: Once | ORAL | Status: AC
  Administered 2015-07-25: 1000 mg via ORAL
  Filled 2015-07-25: qty 2

## 2015-07-25 MED ORDER — SODIUM CHLORIDE 0.9 % IV BOLUS (SEPSIS)
1000.0000 mL | Freq: Once | INTRAVENOUS | Status: AC
  Administered 2015-07-25: 1000 mL via INTRAVENOUS

## 2015-07-25 MED ORDER — ACETAMINOPHEN 325 MG PO TABS
ORAL_TABLET | ORAL | Status: AC
  Filled 2015-07-25: qty 2

## 2015-07-25 MED ORDER — CLINDAMYCIN HCL 300 MG PO CAPS: 300.0000 mg | ORAL_CAPSULE | Freq: Three times a day (TID) | ORAL | Status: DC

## 2015-07-25 NOTE — ED Notes (Signed)
Pt. Reports left forearm skin abscess with no drainage onset 3 days ago ,  denies injury , febrile at triage .

## 2015-07-25 NOTE — ED Notes (Signed)
MD at bedside. 

## 2015-07-25 NOTE — Discharge Instructions (Signed)

## 2015-07-25 NOTE — ED Provider Notes (Signed)
CSN: [id number]     Arrival date & time 07/25/15  1935 History   First MD Initiated Contact with Patient 07/25/15 2108     Chief Complaint  Patient presents with  . Abscess      HPI  Patient presents for evaluation of an infection and redness and pain in his left arm.  History of IV drug use. Denies recent injections. However he states "I don't know" and asked about the etiology of this. No seen for 4-5 days. Fevers and shakes and chills today he presents here.  Past Medical History  Diagnosis Date  . Chronic kidney disease 1984   Past Surgical History  Procedure Laterality Date  . Knee surgery    . Left heart catheterization with coronary angiogram N/A 07/22/2014    Procedure: LEFT HEART CATHETERIZATION WITH CORONARY ANGIOGRAM;  Surgeon: Rinaldo Cloud, MD;  Location: [facility] CATH LAB;  Service: Cardiovascular;  Laterality: N/A;   Family History  Problem Relation Age of Onset  . Diabetes Mellitus II Mother   . Hypertension Father    Social History  Substance Use Topics  . Smoking status: Current Every Day Smoker    Types: Cigarettes  . Smokeless tobacco: None  . Alcohol Use: No    Review of Systems  Constitutional: Positive for fever and chills. Negative for diaphoresis, appetite change and fatigue.  HENT: Negative for mouth sores, sore throat and trouble swallowing.   Eyes: Negative for visual disturbance.  Respiratory: Negative for cough, chest tightness, shortness of breath and wheezing.   Cardiovascular: Negative for chest pain.  Gastrointestinal: Negative for nausea, vomiting, abdominal pain, diarrhea and abdominal distention.  Endocrine: Negative for polydipsia, polyphagia and polyuria.  Genitourinary: Negative for dysuria, frequency and hematuria.  Musculoskeletal: Negative for gait problem.  Skin: Positive for color change. Negative for pallor and rash.  Neurological: Negative for dizziness, syncope, light-headedness and headaches.  Hematological: Does not  bruise/bleed easily.  Psychiatric/Behavioral: Negative for behavioral problems and confusion.      Allergies  Review of patient's allergies indicates no known allergies.  Home Medications   Prior to Admission medications   Medication Sig Start Date End Date Taking? Authorizing Provider  aspirin EC 81 MG EC tablet Take 1 tablet (81 mg total) by mouth daily. 07/22/14  Yes Vassie Loll, MD  carvedilol (COREG) 25 MG tablet Take 25 mg by mouth daily.   Yes Historical Provider, MD  glimepiride (AMARYL) 1 MG tablet Take 1 tablet (1 mg total) by mouth daily with breakfast. 07/22/14  Yes Vassie Loll, MD  ibuprofen (ADVIL,MOTRIN) 200 MG tablet Take 800 mg by mouth every 6 (six) hours as needed for moderate pain (pain and swelling).   Yes Historical Provider, MD  lisinopril (PRINIVIL,ZESTRIL) 10 MG tablet Take 1 tablet (10 mg total) by mouth daily. 07/22/14  Yes Vassie Loll, MD  pravastatin (PRAVACHOL) 40 MG tablet Take 40 mg by mouth daily.   Yes Historical Provider, MD  spironolactone (ALDACTONE) 25 MG tablet Take 1 tablet (25 mg total) by mouth daily. 07/22/14  Yes Vassie Loll, MD  atorvastatin (LIPITOR) 40 MG tablet Take 1 tablet (40 mg total) by mouth daily at 6 PM. Patient not taking: Reported on 07/25/2015 07/22/14   Vassie Loll, MD  carvedilol (COREG) 12.5 MG tablet Take 1 tablet (12.5 mg total) by mouth 2 (two) times daily with a meal. Patient not taking: Reported on 07/25/2015 07/22/14   Vassie Loll, MD  clindamycin (CLEOCIN) 300 MG capsule Take 1 capsule (300 mg total)  by mouth 3 (three) times daily. 07/25/15   Rolland Porter, MD  nicotine (NICODERM CQ - DOSED IN MG/24 HOURS) 21 mg/24hr patch Place 1 patch (21 mg total) onto the skin daily. Patient not taking: Reported on 07/25/2015 07/22/14   Vassie Loll, MD   BP 143/77 mmHg  Pulse 107  Temp(Src) 99 F (37.2 C) (Oral)  Resp 25  Ht  (1.727 m)  Wt 270 lb 9 oz (122.726 kg)  BMI 41.15 kg/m2  SpO2 97% Physical Exam    Constitutional: He is oriented to person, place, and time. He appears well-developed and well-nourished. No distress.  HENT:  Head: Normocephalic.  Eyes: Conjunctivae are normal. Pupils are equal, round, and reactive to light. No scleral icterus.  Neck: Normal range of motion. Neck supple. No thyromegaly present.  Cardiovascular: Normal rate and regular rhythm.  Exam reveals no gallop and no friction rub.   No murmur heard. Pulmonary/Chest: Effort normal and breath sounds normal. No respiratory distress. He has no wheezes. He has no rales.  Abdominal: Soft. Bowel sounds are normal. He exhibits no distension. There is no tenderness. There is no rebound.  Musculoskeletal: Normal range of motion.       Arms: Neurological: He is alert and oriented to person, place, and time.  Skin: Skin is warm and dry. No rash noted.  Psychiatric: He has a normal mood and affect. His behavior is normal.    ED Course  Procedures (including critical care time) Labs Review Labs Reviewed  CBC WITH DIFFERENTIAL/PLATELET - Abnormal; Notable for the following:    WBC 19.5 (*)    Neutro Abs 16.5 (*)    Monocytes Absolute 1.4 (*)    All other components within normal limits  COMPREHENSIVE METABOLIC PANEL - Abnormal; Notable for the following:    Glucose, Bld 192 (*)    Creatinine, Ser 1.30 (*)    ALT 15 (*)    All other components within normal limits  I-STAT CG4 LACTIC ACID, ED - Abnormal; Notable for the following:    Lactic Acid, Venous 2.06 (*)    All other components within normal limits  I-STAT CG4 LACTIC ACID, ED    Imaging Review No results found. I have personally reviewed and evaluated these images and lab results as part of my medical decision-making.   EKG Interpretation None      MDM   Final diagnoses:  Cellulitis of left upper extremity    Patient's temperature improved. Given IV fluids. IV clindamycin and Toradol. He much improved. I discussed treatment with him including  clindamycin by mouth. I've asked him to recheck here if not markedly improving after 72 hours. Return earlier with any additional worsening or new symptoms.    Rolland Porter, MD 07/25/15 2241

## 2015-07-26 ENCOUNTER — Telehealth: Payer: Self-pay | Admitting: *Deleted

## 2015-07-26 NOTE — Telephone Encounter (Signed)
Pt wife called stating they could not afford Rx prescribed.  ERCM suggested to set pt up with PCP ([facility] or [facility]) or have Pharm D at pharmacy call to change to $4 medication. Pt wife states she will have Pharm D call to change rx.

## 2015-09-04 ENCOUNTER — Encounter (HOSPITAL_COMMUNITY): Payer: Self-pay | Admitting: Family Medicine

## 2015-09-04 ENCOUNTER — Emergency Department (HOSPITAL_COMMUNITY)
Admission: EM | Admit: 2015-09-04 | Discharge: 2015-09-04 | Disposition: A | Discharge status: Home / Self Care | Payer: Managed Care, Other (non HMO) | Attending: Emergency Medicine | Admitting: Emergency Medicine

## 2015-09-04 DIAGNOSIS — I252 Old myocardial infarction: Secondary | ICD-10-CM | POA: Insufficient documentation

## 2015-09-04 DIAGNOSIS — L0390 Cellulitis, unspecified: Secondary | ICD-10-CM

## 2015-09-04 DIAGNOSIS — N189 Chronic kidney disease, unspecified: Secondary | ICD-10-CM | POA: Insufficient documentation

## 2015-09-04 DIAGNOSIS — Z79899 Other long term (current) drug therapy: Secondary | ICD-10-CM | POA: Insufficient documentation

## 2015-09-04 DIAGNOSIS — L02512 Cutaneous abscess of left hand: Secondary | ICD-10-CM | POA: Insufficient documentation

## 2015-09-04 DIAGNOSIS — Z9889 Other specified postprocedural states: Secondary | ICD-10-CM | POA: Insufficient documentation

## 2015-09-04 DIAGNOSIS — F17210 Nicotine dependence, cigarettes, uncomplicated: Secondary | ICD-10-CM | POA: Insufficient documentation

## 2015-09-04 DIAGNOSIS — Z7982 Long term (current) use of aspirin: Secondary | ICD-10-CM | POA: Insufficient documentation

## 2015-09-04 DIAGNOSIS — Z7984 Long term (current) use of oral hypoglycemic drugs: Secondary | ICD-10-CM | POA: Insufficient documentation

## 2015-09-04 DIAGNOSIS — E785 Hyperlipidemia, unspecified: Secondary | ICD-10-CM | POA: Insufficient documentation

## 2015-09-04 DIAGNOSIS — L0291 Cutaneous abscess, unspecified: Secondary | ICD-10-CM

## 2015-09-04 DIAGNOSIS — E119 Type 2 diabetes mellitus without complications: Secondary | ICD-10-CM | POA: Insufficient documentation

## 2015-09-04 DIAGNOSIS — L03114 Cellulitis of left upper limb: Secondary | ICD-10-CM | POA: Insufficient documentation

## 2015-09-04 HISTORY — DX: Acute myocardial infarction, unspecified: I21.9

## 2015-09-04 LAB — BASIC METABOLIC PANEL
Anion gap: 9 (ref 5–15)
BUN: 12 mg/dL (ref 6–20)
CHLORIDE: 106 mmol/L (ref 101–111)
CO2: 26 mmol/L (ref 22–32)
Calcium: 9.1 mg/dL (ref 8.9–10.3)
Creatinine, Ser: 0.61 mg/dL (ref 0.61–1.24)
GFR calc non Af Amer: >60 mL/min (ref >60)
Glucose, Bld: 103 mg/dL — ABNORMAL HIGH (ref 65–99)
POTASSIUM: 4.1 mmol/L (ref 3.5–5.1)
SODIUM: 141 mmol/L (ref 135–145)

## 2015-09-04 LAB — CBC WITH DIFFERENTIAL/PLATELET
Basophils Absolute: 0 10*3/uL (ref 0.0–0.1)
Basophils Relative: 0 %
EOS ABS: 0.2 10*3/uL (ref 0.0–0.7)
Eosinophils Relative: 2 %
HEMATOCRIT: 36.3 % — AB (ref 39.0–52.0)
HEMOGLOBIN: 11.7 g/dL — AB (ref 13.0–17.0)
Lymphocytes Relative: 21 %
Lymphs Abs: 2 10*3/uL (ref 0.7–4.0)
MCH: 28.7 pg (ref 26.0–34.0)
MCHC: 32.2 g/dL (ref 30.0–36.0)
MCV: 89.2 fL (ref 78.0–100.0)
MONOS PCT: 8 %
Monocytes Absolute: 0.8 10*3/uL (ref 0.1–1.0)
NEUTROS ABS: 6.8 10*3/uL (ref 1.7–7.7)
NEUTROS PCT: 69 %
Platelets: 310 10*3/uL (ref 150–400)
RBC: 4.07 MIL/uL — AB (ref 4.22–5.81)
RDW: 14.2 % (ref 11.5–15.5)
WBC: 9.8 10*3/uL (ref 4.0–10.5)

## 2015-09-04 LAB — I-STAT CG4 LACTIC ACID, ED: LACTIC ACID, VENOUS: 0.82 mmol/L (ref 0.5–2.0)

## 2015-09-04 MED ORDER — SULFAMETHOXAZOLE-TRIMETHOPRIM 800-160 MG PO TABS: 1.0000 | ORAL_TABLET | Freq: Two times a day (BID) | ORAL | Status: DC

## 2015-09-04 MED ORDER — SODIUM CHLORIDE 0.9 % IV BOLUS (SEPSIS)
1000.0000 mL | Freq: Once | INTRAVENOUS | Status: AC
  Administered 2015-09-04: 1000 mL via INTRAVENOUS

## 2015-09-04 MED ORDER — CLINDAMYCIN PHOSPHATE 600 MG/50ML IV SOLN
600.0000 mg | Freq: Once | INTRAVENOUS | Status: AC
  Administered 2015-09-04: 600 mg via INTRAVENOUS
  Filled 2015-09-04: qty 50

## 2015-09-04 MED ORDER — CEPHALEXIN 500 MG PO CAPS: 500.0000 mg | ORAL_CAPSULE | Freq: Four times a day (QID) | ORAL | Status: DC

## 2015-09-04 MED ORDER — LIDOCAINE HCL (PF) 1 % IJ SOLN
5.0000 mL | Freq: Once | INTRAMUSCULAR | Status: AC
  Administered 2015-09-04: 5 mL via INTRADERMAL
  Filled 2015-09-04: qty 5

## 2015-09-04 NOTE — Discharge Instructions (Signed)
If you see signs of worsening infection (warmth, redness, tenderness, pus, sharp increase in pain, fever, red streaking) immediately return to the emergency department.  Please go to the [facility] urgent care center or the emergency department for a wound recheck and packing removal in 2 days.  Do not hesitate to return to the emergency room for any new, worsening or concerning symptoms.  Please obtain primary care using resource guide below. Let them know that you were seen in the emergency room and that they will need to obtain records for further outpatient management.    Incision and Drainage Incision and drainage is a procedure in which a sac-like structure (cystic structure) is opened and drained. The area to be drained usually contains material such as pus, fluid, or blood.  LET YOUR CAREGIVER KNOW ABOUT:   Allergies to medicine.  Medicines taken, including vitamins, herbs, eyedrops, over-the-counter medicines, and creams.  Use of steroids (by mouth or creams).  Previous problems with anesthetics or numbing medicines.  History of bleeding problems or blood clots.  Previous surgery.  Other health problems, including diabetes and kidney problems.  Possibility of pregnancy, if this applies. RISKS AND COMPLICATIONS  Pain.  Bleeding.  Scarring.  Infection. BEFORE THE PROCEDURE  You may need to have an ultrasound or other imaging tests to see how large or deep your cystic structure is. Blood tests may also be used to determine if you have an infection or how severe the infection is. You may need to have a tetanus shot. PROCEDURE  The affected area is cleaned with a cleaning fluid. The cyst area will then be numbed with a medicine (local anesthetic). A small incision will be made in the cystic structure. A syringe or catheter may be used to drain the contents of the cystic structure, or the contents may be squeezed out. The area will then be flushed with a cleansing  solution. After cleansing the area, it is often gently packed with a gauze or another wound dressing. Once it is packed, it will be covered with gauze and tape or some other type of wound dressing. AFTER THE PROCEDURE   Often, you will be allowed to go home right after the procedure.  You may be given antibiotic medicine to prevent or heal an infection.  If the area was packed with gauze or some other wound dressing, you will likely need to come back in 1 to 2 days to get it removed.  The area should heal in about 14 days.   This information is not intended to replace advice given to you by your health care provider. Make sure you discuss any questions you have with your health care provider.   Document Released: 10/24/2000 Document Revised: 10/30/2011 Document Reviewed: 06/25/2011 Elsevier Interactive Patient Education 2016 [employer].  [employer] Assistance The [employer] 211 is a great source of information about community services available.  Access by dialing 2-1-1 from anywhere in [place], or by website -  PooledIncome.pl.   Other Local Resources (Updated 05/2015)  Financial Assistance   Services    Phone Number and Address  [facility]  Low-cost medical care - 1st and 3rd Saturday of every month  Must not qualify for public or private insurance and must have limited income [phone] [address] [place], [place]    [employer] of Social Services  Child care  Emergency assistance for housing and [employer]  Medicaid [phone] [address] [place], [place] [address]   [facility] Department  Low-cost medical care for children, communicable diseases, sexually-transmitted diseases, immunizations, maternity care, womens health and family planning [phone] [address] [place], [place] [address]  [facility]   Medication assistance for [facility] residents  Must meet income requirements [phone] [address] [place], [place].    [facility]  Child care  Emergency assistance for housing and [employer]  Medicaid [phone] [address] [place], [place] [address]  [facility]   Low-cost medical care,   Monday through Friday, 9 am to 6 pm.   Accepts Medicare/Medicaid, and self-pay [phone] [address] [place], [place] [address]  [facility]  Low-cost medical care - Monday through Friday, 8:30 am - 5:30 pm  Accepts Medicaid and self-pay [phone] [address] [place], [place] [address]   [facility]  Primary medical care, including for those with sickle cell disease  Accepts Medicare, Medicaid, insurance and self-pay [phone] [address] [place], [place]  [facility]   Primary medical care  Accepts Medicare, [place], insurance and self-pay [phone] [address] [place], [place] [address]   [facility] Department of Social Services  Child care  Emergency assistance for housing and [employer]  Medicaid [phone] [address] [place], [place] [address]  [facility] Department of Health and [employer]  Child care  Emergency assistance for housing and [employer]  Medicaid [phone] [address] [place], [place] [address]   [facility] Medication Assistance Program  Medication assistance for [facility] residents with no insurance only  Must have a primary care doctor [phone] [place], [place]  [facility]   Primary medical care  [place], [place], insurance  [phone] [place], [place]  [facility]   Medication  assistance [phone]  [facility]   Primary medical care  Accepts Medicare, [place], insurance and self-pay [phone] [address] [place], [place] [address]  [facility]   Primary medical care  Accepts Medicare, [place], insurance and self-pay [phone] [address] [place], [place] [address]  [facility]  For [place] residents between the ages of 52 and 67 who do not have any form of health insurance, Medicare, [place], or [place] benefits.  Services are provided free of charge to uninsured patients who fall within federal poverty guidelines.    Hours: Tuesdays and Thursdays, 4:15 - 8 pm [phone] [address] [place], [place] [address]  [facility]     Primary medical care  Dental care  Nutritional counseling  Pharmacy  Accepts Medicaid, Medicare, most insurance.  Fees are adjusted based on ability to pay.   [phone] [facility] [address] [place], [place]  [phone] [facility] [address] [place], [place]  [phone] [facility] [place], [place]  [phone] [facility], [address] [place], [place]  [phone] [facility] [address] [place], [place]  [facility]  Womens health and family planning [phone] [address] [place], [place]  [facility] Department of Social Services  Child care  Emergency assistance for housing and [employer]  Medicaid [phone] [address] [place], [place] [address]   [facility]    Ages 4 and older  Hours: Mondays and Thursdays, 7:00 am - 9:00 am Patients are seen on a first come, first served basis. [phone], ext. [phone] [place], [place]  [facility] Division of Social Services  Child care  Emergency  assistance for housing and [employer]  Medicaid [phone] [address] [place], [place] [address]  [facility]  Medication assistance  Rental assistance  Food pantry  Medication assistance  Housing assistance  Emergency food distribution  Utility assistance [phone] [address] [place], [place]  [phone]  [address] [place], [place] [address] Hours: Tuesdays and Thursdays from 9am - 12 noon by appointment only  [phone] [address] [place], [place] [address]  [facility] - Lanae Boast   Accepts private insurance, [place], and [place].  Payment is based on a sliding scale for those without insurance.  Hours: Mondays, Tuesdays and Thursdays, 8:30 am - 5:30 pm.   [phone] [address] [place], [place]  [facility] - Family Medicine at [facility], [place], and [place].  Payment is based on a sliding scale for those without insurance. [phone] [address] [place], [place]  [facility] - Pediatrics at [address] Scientist, research (physical sciences), [employer], and [place].  Payment is based on a sliding scale for those without insurance [phone] [address], [employer], [place]  [facility] - Pediatrics at [employer], [place], and [place].  Payment is based on a sliding scale for those without insurance. [phone] [address] [place], [place]  [facility] - Pediatrics at [facility], [place], and [place].  Payment is based on a sliding scale for those without insurance. [phone], ext. [phone] [place], [place].    [facility]  Maternity care.  Accepts Medicaid and self-pay. [phone] [address] [place], [place]

## 2015-09-04 NOTE — ED Provider Notes (Signed)
CSN: [id number]     Arrival date & time 09/04/15  2248 History   First MD Initiated Contact with Patient 09/04/15 [phone]     Chief Complaint  Patient presents with  . Cellulitis     (Consider location/radiation/quality/duration/timing/severity/associated sxs/prior Treatment) HPI   Blood pressure 123/93, pulse 100, temperature 98.2 F (36.8 C), temperature source Oral, SpO2 96 %.  Larry Mason is a 40 y.o. 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 Patient states he has not had any IV drug use for 6 weeks. Was treated for cellulitis with clindamycin approximately 6 weeks ago. He denies fevers, chills, nausea, vomiting.   Past Medical History  Diagnosis Date  . Chronic kidney disease 1984  . Diabetes mellitus without complication (HCC)   . Myocardial infarction [facility])    Past Surgical History  Procedure Laterality Date  . Knee surgery    . Left heart catheterization with coronary angiogram N/A 07/22/2014    Procedure: LEFT HEART CATHETERIZATION WITH CORONARY ANGIOGRAM;  Surgeon: Rinaldo Cloud, MD;  Location: [facility] CATH LAB;  Service: Cardiovascular;  Laterality: N/A;   Family History  Problem Relation Age of Onset  . Diabetes Mellitus II Mother   . Hypertension Father    Social History  Substance Use Topics  . Smoking status: Current Every Day Smoker -- 0.50 packs/day    Types: Cigarettes  . Smokeless tobacco: None  . Alcohol Use: No    Review of Systems  10 systems reviewed and found to be negative, except as noted in the HPI.   Allergies  Review of patient's allergies indicates no known allergies.  Home Medications   Prior to Admission medications   Medication Sig Start Date End Date Taking? Authorizing Provider  aspirin EC 81 MG EC tablet Take 1 tablet (81 mg total) by mouth daily.  07/22/14   Vassie Loll, MD  atorvastatin (LIPITOR) 40 MG tablet Take 1 tablet (40 mg total) by mouth daily at 6 PM. Patient not taking: Reported on 07/25/2015 07/22/14   Vassie Loll, MD  carvedilol (COREG) 12.5 MG tablet Take 1 tablet (12.5 mg total) by mouth 2 (two) times daily with a meal. Patient not taking: Reported on 07/25/2015 07/22/14   Vassie Loll, MD  carvedilol (COREG) 25 MG tablet Take 25 mg by mouth daily.    Historical Provider, MD  cephALEXin (KEFLEX) 500 MG capsule Take 1 capsule (500 mg total) by mouth 4 (four) times daily. 09/04/15   Rutger Salton, PA-C  clindamycin (CLEOCIN) 300 MG capsule Take 1 capsule (300 mg total) by mouth 3 (three) times daily. 07/25/15   Rolland Porter, MD  glimepiride (AMARYL) 1 MG tablet Take 1 tablet (1 mg total) by mouth daily with breakfast. 07/22/14   Vassie Loll, MD  ibuprofen (ADVIL,MOTRIN) 200 MG tablet Take 800 mg by mouth every 6 (six) hours as needed for moderate pain (pain and swelling).    Historical Provider, MD  lisinopril (PRINIVIL,ZESTRIL) 10 MG tablet Take 1 tablet (10 mg total) by mouth daily. 07/22/14   Vassie Loll, MD  nicotine (NICODERM CQ - DOSED IN MG/24 HOURS) 21 mg/24hr patch Place 1 patch (21 mg total) onto the skin daily. Patient not taking: Reported on 07/25/2015 07/22/14   Vassie Loll, MD  pravastatin (PRAVACHOL) 40 MG tablet Take 40 mg by mouth daily.    Historical Provider, MD  spironolactone (ALDACTONE) 25  MG tablet Take 1 tablet (25 mg total) by mouth daily. 07/22/14   Vassie Loll, MD  sulfamethoxazole-trimethoprim (BACTRIM DS) 800-160 MG tablet Take 1 tablet by mouth 2 (two) times daily. 09/04/15   Damien Cisar, PA-C   BP 116/83 mmHg  Pulse 99  Temp(Src) 98.2 F (36.8 C) (Oral)  Resp 18  SpO2 96% Physical Exam  Constitutional: He is oriented to person, place, and time. He appears well-developed and well-nourished. No distress.  HENT:  Head: Normocephalic.  Eyes: Conjunctivae and EOM are normal.    Cardiovascular: Normal rate, regular rhythm and intact distal pulses.   Pulmonary/Chest: Effort normal and breath sounds normal. No stridor. No respiratory distress. He has no wheezes. He has no rales. He exhibits no tenderness.  Abdominal: Soft. He exhibits no distension and no mass. There is no tenderness. There is no rebound.  Musculoskeletal: Normal range of motion. He exhibits edema and tenderness.  Neurological: He is alert and oriented to person, place, and time.  Skin:  Fluctuant abscess to left first metacarpal area on the dorsal side with moderate surrounding cellulitis as pictured. No active drainage. Distally neurovascularly intact. Compartments are soft with excellent range of motion, no pain on passive extension.  Extensive injection track marks to left arm including on the hand in the area of the abscess  Psychiatric: He has a normal mood and affect.  Nursing note and vitals reviewed.         ED Course  .Marland KitchenIncision and Drainage Date/Time: 09/04/2015 12:27 PM Performed by: Wynetta Emery Authorized by: Wynetta Emery Consent: Verbal consent obtained. Consent given by: patient Patient identity confirmed: verbally with patient Type: abscess Body area: upper extremity Location details: left hand Anesthesia: local infiltration Local anesthetic: lidocaine 1% without epinephrine Anesthetic total: 1 ml Scalpel size: 11 Incision type: single straight Incision depth: dermal Complexity: complex Drainage: purulent and  bloody Drainage amount: moderate Wound treatment: wound left open Packing material: 1/4 in iodoform gauze Patient tolerance: Patient tolerated the procedure well with no immediate complications   (including critical care time)  EMERGENCY DEPARTMENT US SOFT TISSUE INTERPRETATION "Study: Limited Ultrasound of the noted body part in comments below"  INDICATIONS: Soft tissue infection Multiple views of the body part are obtained with a  multi-frequency linear probe  PERFORMED BY:  Myself  IMAGES ARCHIVED?: Yes  SIDE:Left  BODY PART:Upper extremity  FINDINGS: Abcess present and Cellulitis present      Labs Review Labs Reviewed  CBC WITH DIFFERENTIAL/PLATELET - Abnormal; Notable for the following:    RBC 4.07 (*)    Hemoglobin 11.7 (*)    HCT 36.3 (*)    All other components within normal limits  BASIC METABOLIC PANEL - Abnormal; Notable for the following:    Glucose, Bld 103 (*)    All other components within normal limits  I-STAT CG4 LACTIC ACID, ED    Imaging Review No results found. I have personally reviewed and evaluated these images and lab results as part of my medical decision-making.   EKG Interpretation None      MDM   Final diagnoses:  Abscess and cellulitis   Filed Vitals:   09/04/15 1001 09/04/15 1030  BP: 123/93 116/83  Pulse: 100 99  Temp: 98.2 F (36.8 C)   TempSrc: Oral   Resp:  18  SpO2: 96% 96%    Medications  sodium chloride 0.9 % bolus 1,000 mL (0 mLs Intravenous Stopped 09/04/15 1220)  clindamycin (CLEOCIN) IVPB 600 mg (0 mg Intravenous Stopped 09/04/15  1129)  lidocaine (PF) (XYLOCAINE) 1 % injection 5 mL (5 mLs Intradermal Given by Other 09/04/15 1128)    JACKSEN ISIP is 40 y.o. male with history of IV drug use presenting with abscess and cellulitis to left hand. This appears to be well localized, no signs of systemic infection. Ultrasound with small fluid collection, I and D performed with a moderate amount of purulent drainage. Wound packed and patient will be started on Keflex and Bactrim (states last time he was here was given prescription for clindamycin and could not afford it is he is uninsured. Extensive discussion of return precautions and patient verbalizes understanding.   Evaluation does not show pathology that would require ongoing emergent intervention or inpatient treatment. Pt is hemodynamically stable and mentating appropriately. Discussed  findings and plan with patient/guardian, who agrees with care plan. All questions answered. Return precautions discussed and outpatient follow up given.   New Prescriptions   CEPHALEXIN (KEFLEX) 500 MG CAPSULE    Take 1 capsule (500 mg total) by mouth 4 (four) times daily.   SULFAMETHOXAZOLE-TRIMETHOPRIM (BACTRIM DS) 800-160 MG TABLET    Take 1 tablet by mouth 2 (two) times daily.         Wynetta Emery, PA-C 09/04/15 1233  Pricilla Loveless, MD 09/08/15 [phone]

## 2015-09-04 NOTE — ED Notes (Signed)
Pt presents from home with c/o left hand infection.  He was seen a few weeks ago in [facility] for the same and was given antibiotics which he says he completed without missing a dose about 1 week ago.  States infection returned over the last few days.  Area erythematous, edematous, hot to touch, and tender on palpation.  Radial intact, cap refill <3sec.

## 2015-09-04 NOTE — ED Notes (Signed)
Pt comfortable with discharge and follow up instructions. Pt declines wheelchair, escorted to waiting area by this RN. Rx x2 

## 2017-08-23 ENCOUNTER — Other Ambulatory Visit: Payer: Self-pay

## 2017-08-23 ENCOUNTER — Emergency Department (HOSPITAL_BASED_OUTPATIENT_CLINIC_OR_DEPARTMENT_OTHER)
Admission: EM | Admit: 2017-08-23 | Discharge: 2017-08-23 | Disposition: A | Discharge status: Home / Self Care | Payer: Managed Care, Other (non HMO) | Attending: Emergency Medicine | Admitting: Emergency Medicine

## 2017-08-23 ENCOUNTER — Telehealth (HOSPITAL_BASED_OUTPATIENT_CLINIC_OR_DEPARTMENT_OTHER): Payer: Self-pay | Admitting: Emergency Medicine

## 2017-08-23 ENCOUNTER — Emergency Department (HOSPITAL_COMMUNITY): Payer: Self-pay

## 2017-08-23 ENCOUNTER — Inpatient Hospital Stay (HOSPITAL_COMMUNITY)
Admission: EM | Admit: 2017-08-23 | Discharge: 2017-08-29 | DRG: 488 | Disposition: A | Discharge status: Home Health | Payer: Self-pay | Attending: Family Medicine | Admitting: Family Medicine

## 2017-08-23 ENCOUNTER — Encounter (HOSPITAL_COMMUNITY): Payer: Self-pay

## 2017-08-23 ENCOUNTER — Encounter (HOSPITAL_BASED_OUTPATIENT_CLINIC_OR_DEPARTMENT_OTHER): Payer: Self-pay | Admitting: Emergency Medicine

## 2017-08-23 DIAGNOSIS — Z833 Family history of diabetes mellitus: Secondary | ICD-10-CM

## 2017-08-23 DIAGNOSIS — F1110 Opioid abuse, uncomplicated: Secondary | ICD-10-CM | POA: Diagnosis present

## 2017-08-23 DIAGNOSIS — E1122 Type 2 diabetes mellitus with diabetic chronic kidney disease: Secondary | ICD-10-CM | POA: Diagnosis present

## 2017-08-23 DIAGNOSIS — M009 Pyogenic arthritis, unspecified: Principal | ICD-10-CM | POA: Diagnosis present

## 2017-08-23 DIAGNOSIS — F17210 Nicotine dependence, cigarettes, uncomplicated: Secondary | ICD-10-CM | POA: Insufficient documentation

## 2017-08-23 DIAGNOSIS — I5042 Chronic combined systolic (congestive) and diastolic (congestive) heart failure: Secondary | ICD-10-CM

## 2017-08-23 DIAGNOSIS — I130 Hypertensive heart and chronic kidney disease with heart failure and stage 1 through stage 4 chronic kidney disease, or unspecified chronic kidney disease: Secondary | ICD-10-CM | POA: Diagnosis present

## 2017-08-23 DIAGNOSIS — I10 Essential (primary) hypertension: Secondary | ICD-10-CM

## 2017-08-23 DIAGNOSIS — I5022 Chronic systolic (congestive) heart failure: Secondary | ICD-10-CM | POA: Diagnosis present

## 2017-08-23 DIAGNOSIS — Z8249 Family history of ischemic heart disease and other diseases of the circulatory system: Secondary | ICD-10-CM

## 2017-08-23 DIAGNOSIS — F1410 Cocaine abuse, uncomplicated: Secondary | ICD-10-CM | POA: Insufficient documentation

## 2017-08-23 DIAGNOSIS — F1910 Other psychoactive substance abuse, uncomplicated: Secondary | ICD-10-CM

## 2017-08-23 DIAGNOSIS — Z7984 Long term (current) use of oral hypoglycemic drugs: Secondary | ICD-10-CM

## 2017-08-23 DIAGNOSIS — Z79899 Other long term (current) drug therapy: Secondary | ICD-10-CM | POA: Insufficient documentation

## 2017-08-23 DIAGNOSIS — N189 Chronic kidney disease, unspecified: Secondary | ICD-10-CM | POA: Diagnosis present

## 2017-08-23 DIAGNOSIS — I252 Old myocardial infarction: Secondary | ICD-10-CM | POA: Insufficient documentation

## 2017-08-23 DIAGNOSIS — I5032 Chronic diastolic (congestive) heart failure: Secondary | ICD-10-CM

## 2017-08-23 DIAGNOSIS — E119 Type 2 diabetes mellitus without complications: Secondary | ICD-10-CM | POA: Insufficient documentation

## 2017-08-23 DIAGNOSIS — Z23 Encounter for immunization: Secondary | ICD-10-CM

## 2017-08-23 DIAGNOSIS — Z7982 Long term (current) use of aspirin: Secondary | ICD-10-CM

## 2017-08-23 DIAGNOSIS — E876 Hypokalemia: Secondary | ICD-10-CM | POA: Diagnosis present

## 2017-08-23 DIAGNOSIS — Z6841 Body Mass Index (BMI) 40.0 and over, adult: Secondary | ICD-10-CM

## 2017-08-23 DIAGNOSIS — M25561 Pain in right knee: Secondary | ICD-10-CM

## 2017-08-23 DIAGNOSIS — M25461 Effusion, right knee: Secondary | ICD-10-CM

## 2017-08-23 DIAGNOSIS — M00861 Arthritis due to other bacteria, right knee: Secondary | ICD-10-CM

## 2017-08-23 HISTORY — DX: Type 2 diabetes mellitus without complications: E11.9

## 2017-08-23 HISTORY — DX: Morbid (severe) obesity due to excess calories: E66.01

## 2017-08-23 HISTORY — DX: Cocaine abuse, uncomplicated: F14.10

## 2017-08-23 HISTORY — DX: Heart failure, unspecified: I50.9

## 2017-08-23 HISTORY — DX: Opioid abuse, uncomplicated: F11.10

## 2017-08-23 HISTORY — DX: Other psychoactive substance use, unspecified, uncomplicated: F19.90

## 2017-08-23 HISTORY — DX: Essential (primary) hypertension: I10

## 2017-08-23 LAB — CBC WITH DIFFERENTIAL/PLATELET
Basophils Absolute: 0 10*3/uL (ref 0.0–0.1)
Basophils Relative: 0 %
Eosinophils Absolute: 0.2 10*3/uL (ref 0.0–0.7)
Eosinophils Relative: 1 %
HCT: 34.5 % — ABNORMAL LOW (ref 39.0–52.0)
Hemoglobin: 10.6 g/dL — ABNORMAL LOW (ref 13.0–17.0)
Lymphocytes Relative: 13 %
Lymphs Abs: 2 10*3/uL (ref 0.7–4.0)
MCH: 26.6 pg (ref 26.0–34.0)
MCHC: 30.7 g/dL (ref 30.0–36.0)
MCV: 86.7 fL (ref 78.0–100.0)
Monocytes Absolute: 1.2 10*3/uL — ABNORMAL HIGH (ref 0.1–1.0)
Monocytes Relative: 8 %
Neutro Abs: 11.9 10*3/uL — ABNORMAL HIGH (ref 1.7–7.7)
Neutrophils Relative %: 78 %
Platelets: 340 10*3/uL (ref 150–400)
RBC: 3.98 MIL/uL — ABNORMAL LOW (ref 4.22–5.81)
RDW: 14.5 % (ref 11.5–15.5)
WBC: 15.3 10*3/uL — ABNORMAL HIGH (ref 4.0–10.5)

## 2017-08-23 LAB — BASIC METABOLIC PANEL
Anion gap: 10 (ref 5–15)
BUN: 16 mg/dL (ref 6–20)
CO2: 28 mmol/L (ref 22–32)
Calcium: 9.1 mg/dL (ref 8.9–10.3)
Chloride: 96 mmol/L — ABNORMAL LOW (ref 101–111)
Creatinine, Ser: 0.61 mg/dL (ref 0.61–1.24)
GFR calc Af Amer: >60 mL/min (ref >60)
GFR calc non Af Amer: >60 mL/min (ref >60)
Glucose, Bld: 137 mg/dL — ABNORMAL HIGH (ref 65–99)
Potassium: 4.2 mmol/L (ref 3.5–5.1)
Sodium: 134 mmol/L — ABNORMAL LOW (ref 135–145)

## 2017-08-23 LAB — SYNOVIAL CELL COUNT + DIFF, W/ CRYSTALS
CRYSTALS FLUID: NONE SEEN
Lymphocytes-Synovial Fld: 1 % (ref 0–20)
Monocyte-Macrophage-Synovial Fluid: 10 % — ABNORMAL LOW (ref 50–90)
Neutrophil, Synovial: 89 % — ABNORMAL HIGH (ref 0–25)
WBC, Synovial: 143750 /mm3 — ABNORMAL HIGH (ref 0–200)

## 2017-08-23 LAB — SEDIMENTATION RATE: Sed Rate: 84 mm/hr — ABNORMAL HIGH (ref 0–16)

## 2017-08-23 LAB — GLUCOSE, CAPILLARY: GLUCOSE-CAPILLARY: 218 mg/dL — AB (ref 65–99)

## 2017-08-23 LAB — C-REACTIVE PROTEIN: CRP: 19.8 mg/dL — AB (ref <1.0)

## 2017-08-23 MED ORDER — ONDANSETRON HCL 4 MG/2ML IJ SOLN
4.0000 mg | Freq: Once | INTRAMUSCULAR | Status: AC
  Administered 2017-08-23: 4 mg via INTRAVENOUS
  Filled 2017-08-23: qty 2

## 2017-08-23 MED ORDER — SODIUM CHLORIDE 0.9% FLUSH: 3.0000 mL | INTRAVENOUS | Status: DC | PRN

## 2017-08-23 MED ORDER — HYDROMORPHONE HCL 1 MG/ML IJ SOLN
1.0000 mg | Freq: Once | INTRAMUSCULAR | Status: AC
Start: 2017-08-23 — End: 2017-08-23
  Administered 2017-08-23: 1 mg via INTRAVENOUS
  Filled 2017-08-23: qty 1

## 2017-08-23 MED ORDER — SODIUM CHLORIDE 0.9 % IV SOLN
2500.0000 mg | Freq: Once | INTRAVENOUS | Status: AC
  Administered 2017-08-23: 2500 mg via INTRAVENOUS
  Filled 2017-08-23: qty 2500

## 2017-08-23 MED ORDER — CEFAZOLIN SODIUM 10 G IJ SOLR
3.0000 g | INTRAMUSCULAR | Status: AC
  Administered 2017-08-24: 3 g via INTRAVENOUS
  Filled 2017-08-23 (×2): qty 3000

## 2017-08-23 MED ORDER — ACETAMINOPHEN 325 MG PO TABS
650.0000 mg | ORAL_TABLET | Freq: Four times a day (QID) | ORAL | Status: DC | PRN
  Administered 2017-08-23 – 2017-08-25 (×5): 650 mg via ORAL
  Filled 2017-08-23 (×5): qty 2

## 2017-08-23 MED ORDER — VANCOMYCIN HCL 10 G IV SOLR
1250.0000 mg | Freq: Three times a day (TID) | INTRAVENOUS | Status: DC
  Administered 2017-08-24 – 2017-08-25 (×3): 1250 mg via INTRAVENOUS
  Filled 2017-08-23 (×5): qty 1250

## 2017-08-23 MED ORDER — LISINOPRIL 10 MG PO TABS
10.0000 mg | ORAL_TABLET | Freq: Every day | ORAL | Status: DC
  Administered 2017-08-24 – 2017-08-26 (×3): 10 mg via ORAL
  Filled 2017-08-23 (×3): qty 1

## 2017-08-23 MED ORDER — ACETAMINOPHEN 650 MG RE SUPP: 650.0000 mg | Freq: Four times a day (QID) | RECTAL | Status: DC | PRN

## 2017-08-23 MED ORDER — NICOTINE 21 MG/24HR TD PT24
21.0000 mg | MEDICATED_PATCH | Freq: Every day | TRANSDERMAL | Status: DC
  Administered 2017-08-24 – 2017-08-29 (×7): 21 mg via TRANSDERMAL
  Filled 2017-08-23 (×7): qty 1

## 2017-08-23 MED ORDER — HYDROMORPHONE HCL 2 MG/ML IJ SOLN: 0.2500 mg | INTRAMUSCULAR | Status: DC | PRN

## 2017-08-23 MED ORDER — SODIUM CHLORIDE 0.9% FLUSH
3.0000 mL | Freq: Two times a day (BID) | INTRAVENOUS | Status: DC
  Administered 2017-08-24 – 2017-08-29 (×7): 3 mL via INTRAVENOUS

## 2017-08-23 MED ORDER — CHLORHEXIDINE GLUCONATE 4 % EX LIQD
60.0000 mL | Freq: Once | CUTANEOUS | Status: AC
Start: 2017-08-23 — End: 2017-08-24
  Administered 2017-08-24: 4 via TOPICAL
  Filled 2017-08-23: qty 60

## 2017-08-23 MED ORDER — HYDROMORPHONE HCL 1 MG/ML IJ SOLN
1.0000 mg | Freq: Once | INTRAMUSCULAR | Status: AC
  Administered 2017-08-23: 1 mg via INTRAVENOUS
  Filled 2017-08-23: qty 1

## 2017-08-23 MED ORDER — PIPERACILLIN-TAZOBACTAM 3.375 G IVPB 30 MIN
3.3750 g | Freq: Once | INTRAVENOUS | Status: AC
  Administered 2017-08-23: 3.375 g via INTRAVENOUS
  Filled 2017-08-23: qty 50

## 2017-08-23 MED ORDER — PIPERACILLIN-TAZOBACTAM 3.375 G IVPB
3.3750 g | Freq: Three times a day (TID) | INTRAVENOUS | Status: DC
  Administered 2017-08-24 – 2017-08-26 (×6): 3.375 g via INTRAVENOUS
  Filled 2017-08-23 (×8): qty 50

## 2017-08-23 MED ORDER — CARVEDILOL 25 MG PO TABS
25.0000 mg | ORAL_TABLET | Freq: Two times a day (BID) | ORAL | Status: DC
  Administered 2017-08-24 – 2017-08-29 (×11): 25 mg via ORAL
  Filled 2017-08-23: qty 1
  Filled 2017-08-23: qty 2
  Filled 2017-08-23 (×10): qty 1

## 2017-08-23 MED ORDER — SODIUM CHLORIDE 0.9 % IV SOLN: 250.0000 mL | INTRAVENOUS | Status: DC | PRN

## 2017-08-23 MED ORDER — POVIDONE-IODINE 10 % EX SWAB
2.0000 | Freq: Once | CUTANEOUS | Status: DC
Start: 2017-08-23 — End: 2017-08-24

## 2017-08-23 MED ORDER — SPIRONOLACTONE 25 MG PO TABS
25.0000 mg | ORAL_TABLET | Freq: Every day | ORAL | Status: DC
  Administered 2017-08-24 – 2017-08-29 (×6): 25 mg via ORAL
  Filled 2017-08-23 (×6): qty 1

## 2017-08-23 MED ORDER — NAPROXEN 250 MG PO TABS
500.0000 mg | ORAL_TABLET | Freq: Once | ORAL | Status: AC
  Administered 2017-08-23: 500 mg via ORAL
  Filled 2017-08-23: qty 2

## 2017-08-23 MED ORDER — INSULIN ASPART 100 UNIT/ML ~~LOC~~ SOLN
0.0000 [IU] | Freq: Three times a day (TID) | SUBCUTANEOUS | Status: DC
  Administered 2017-08-24: 3 [IU] via SUBCUTANEOUS
  Administered 2017-08-24: 2 [IU] via SUBCUTANEOUS
  Administered 2017-08-25: 3 [IU] via SUBCUTANEOUS
  Administered 2017-08-25 – 2017-08-26 (×3): 2 [IU] via SUBCUTANEOUS
  Administered 2017-08-26: 1 [IU] via SUBCUTANEOUS
  Administered 2017-08-27: 2 [IU] via SUBCUTANEOUS
  Administered 2017-08-27: 1 [IU] via SUBCUTANEOUS
  Administered 2017-08-28 (×2): 2 [IU] via SUBCUTANEOUS
  Administered 2017-08-28 – 2017-08-29 (×3): 1 [IU] via SUBCUTANEOUS

## 2017-08-23 MED ORDER — TRAMADOL HCL 50 MG PO TABS
50.0000 mg | ORAL_TABLET | Freq: Four times a day (QID) | ORAL | Status: DC | PRN
  Administered 2017-08-23 – 2017-08-29 (×20): 50 mg via ORAL
  Filled 2017-08-23 (×20): qty 1

## 2017-08-23 MED ORDER — HEPARIN SODIUM (PORCINE) 5000 UNIT/ML IJ SOLN: 5000.0000 [IU] | Freq: Three times a day (TID) | INTRAMUSCULAR | Status: DC

## 2017-08-23 MED ORDER — LACTATED RINGERS IV BOLUS
1000.0000 mL | Freq: Once | INTRAVENOUS | Status: AC
  Administered 2017-08-23: 1000 mL via INTRAVENOUS

## 2017-08-23 NOTE — ED Triage Notes (Signed)
Pt here due to being called about abnormal results/possible right knee infection after being d/c from [facility] last night. Pt has knee tapped and results came back abnormal, pt called and told to come back in for antibiotics.

## 2017-08-23 NOTE — ED Notes (Signed)
Patient transported to CT 

## 2017-08-23 NOTE — Progress Notes (Signed)
Pharmacy Antibiotic Note  Larry Mason is a 42 y.o. male admitted on 08/23/2017 with possible septic knee  Plan: Zosyn 3.375 gm iv q8h Vanc 2500 mg x 1 then 1250 q8 Monitor renal fx cx vt prn F/u post-op plans/cx?  Height: 5\' 7"  (170.2 cm) Weight: 290 lb (131.5 kg) IBW/kg (Calculated) : 66.1  Temp (24hrs), Avg:98.4 F (36.9 C), Min:98.2 F (36.8 C), Max:98.6 F (37 C)  Recent Labs  Lab 08/23/17 1428  WBC 15.3*  CREATININE 0.61    Estimated Creatinine Clearance: 155.4 mL/min (by C-G formula based on SCr of 0.61 mg/dL).    No Known Allergies  Isaac Bliss, PharmD, BCPS, BCCCP Clinical Pharmacist Clinical phone for 08/23/2017 from 1430 [phone]: [phone] If after 2300, please call main pharmacy at: [phone] 08/23/2017 5:42 PM

## 2017-08-23 NOTE — ED Provider Notes (Addendum)
[facility] EMERGENCY DEPARTMENT Provider Note   CSN: [id number] Arrival date & time: 08/23/17  1321     History   Chief Complaint Chief Complaint  Patient presents with  . Knee Pain    HPI Larry Mason is a 42 y.o. male.  Patient seen at [facility] last night had arthrocentesis done which came back with 143,000 white blood cells are 89% neutrophils.  Called back and told to return here.  Patient states that his effusion return department 4 hours after leaving the emergency room and the pain is severe.  He has pain with range of motion, pain at rest.  He is noticed warmth and redness in the last hour or 2.  No fevers.  Does have nausea.   Knee Pain   This is a new problem. The current episode started yesterday. The problem occurs constantly. The problem has been gradually worsening. The pain is present in the right knee. The quality of the pain is described as aching and sharp. The pain is moderate. Associated symptoms include limited range of motion and stiffness. He has tried nothing for the symptoms. There has been no history of extremity trauma.    Past Medical History:  Diagnosis Date  . Chronic kidney disease 1984  . Cocaine abuse (HCC)   . Diabetes mellitus without complication (HCC)   . Heroin abuse (HCC)   . IVDU (intravenous drug user)   . Morbid obesity (HCC)   . Myocardial infarction [facility])     Patient Active Problem List   Diagnosis Date Noted  . Septic joint (HCC) 08/23/2017  . Diabetes (HCC) 07/22/2014  . Pain in the chest   . Chronic combined systolic and diastolic congestive heart failure (HCC)   . Chest pain 07/21/2014  . Essential hypertension   . Elevated troponin 07/20/2014  . Swelling of right lower extremity 07/20/2014  . Heroin abuse (HCC) 07/20/2014  . Cocaine abuse (HCC) 07/20/2014  . SOB (shortness of breath) 07/20/2014  . Dyspnea 07/20/2014    Past Surgical History:  Procedure Laterality Date  . KNEE  SURGERY    . LEFT HEART CATHETERIZATION WITH CORONARY ANGIOGRAM N/A 07/22/2014   Procedure: LEFT HEART CATHETERIZATION WITH CORONARY ANGIOGRAM;  Surgeon: Rinaldo Cloud, MD;  Location: [facility] CATH LAB;  Service: Cardiovascular;  Laterality: N/A;        Home Medications    Prior to Admission medications   Medication Sig Start Date End Date Taking? Authorizing Provider  carvedilol (COREG) 25 MG tablet Take 25 mg by mouth 2 (two) times daily with a meal.   Yes [provider]  glimepiride (AMARYL) 1 MG tablet Take 1 tablet (1 mg total) by mouth daily with breakfast. 07/22/14  Yes Vassie Loll, MD  ibuprofen (ADVIL,MOTRIN) 200 MG tablet Take 800 mg by mouth every 6 (six) hours as needed for moderate pain (pain and swelling).   Yes [provider]  lisinopril (PRINIVIL,ZESTRIL) 10 MG tablet Take 1 tablet (10 mg total) by mouth daily. 07/22/14  Yes Vassie Loll, MD  spironolactone (ALDACTONE) 25 MG tablet Take 1 tablet (25 mg total) by mouth daily. 07/22/14  Yes Vassie Loll, MD    Family History Family History  Problem Relation Age of Onset  . Diabetes Mellitus II Mother   . Hypertension Father     Social History Social History   Tobacco Use  . Smoking status: Current Every Day Smoker    Packs/day: 0.50    Types: Cigarettes  Substance Use Topics  . Alcohol use: No  . Drug use: Yes    Comment: heroin last 6 weeks ago     Allergies   Patient has no known allergies.   Review of Systems Review of Systems  Musculoskeletal: Positive for stiffness.  All other systems reviewed and are negative.    Physical Exam Updated Vital Signs BP (!) 130/95   Pulse 91   Temp 98.4 F (36.9 C) (Oral)   Resp 18   Ht 5\' 7"  (1.702 m)   Wt 131.5 kg (290 lb)   SpO2 91% Comment: placed pt on 2L [place]  BMI 45.42 kg/m   Physical Exam  Constitutional: He is oriented to person, place, and time. He appears well-developed and well-nourished.  HENT:  Head: Normocephalic and  atraumatic.  Eyes: Conjunctivae and EOM are normal.  Neck: Normal range of motion.  Cardiovascular: Normal rate.  Pulmonary/Chest: Effort normal. No respiratory distress.  Abdominal: Soft. Bowel sounds are normal. He exhibits no distension.  Musculoskeletal: He exhibits edema and tenderness (of right knee with warmth and erythema).  Neurological: He is alert and oriented to person, place, and time. No cranial nerve deficit. Coordination normal.  Skin: Skin is warm and dry.  Nursing note and vitals reviewed.    ED Treatments / Results  Labs (all labs ordered are listed, but only abnormal results are displayed) Labs Reviewed  CBC WITH DIFFERENTIAL/PLATELET - Abnormal; Notable for the following components:      Result Value   WBC 15.3 (*)    RBC 3.98 (*)    Hemoglobin 10.6 (*)    HCT 34.5 (*)    Neutro Abs 11.9 (*)    Monocytes Absolute 1.2 (*)    All other components within normal limits  BASIC METABOLIC PANEL - Abnormal; Notable for the following components:   Sodium 134 (*)    Chloride 96 (*)    Glucose, Bld 137 (*)    All other components within normal limits  C-REACTIVE PROTEIN - Abnormal; Notable for the following components:   CRP 19.8 (*)    All other components within normal limits  SEDIMENTATION RATE  RAPID URINE DRUG SCREEN, HOSP PERFORMED    EKG None  Radiology Dg Knee Complete 4 Views Right  Result Date: 08/23/2017 CLINICAL DATA:  RIGHT knee pain and swelling for 2 days, no known injury EXAM: RIGHT KNEE - COMPLETE 4+ VIEW COMPARISON:  MR RIGHT knee 03/22/2007 FINDINGS: Low normal osseous mineralization. Tricompartmental joint space narrowing and spur formation. Bone-on-bone appearance at medial compartment. No acute fracture, dislocation, or bone destruction. Probable calcified loose body posteriorly. Large joint effusion. Remaining soft tissues unremarkable. 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 No definite acute bony abnormalities. Electronically Signed   By: Ulyses Southward M.D.   On: 08/23/2017 15:35    Procedures Procedures (including critical care time)  Medications Ordered in ED Medications  lactated ringers bolus 1,000 mL (1,000 mLs Intravenous New Bag/Given 08/23/17 1451)  vancomycin (VANCOCIN) 2,500 mg in sodium chloride 0.9 % 500 mL IVPB (has no administration in time range)  piperacillin-tazobactam (ZOSYN) IVPB 3.375 g (3.375 g Intravenous New Bag/Given 08/23/17 1638)  HYDROmorphone (DILAUDID) injection 1 mg (1 mg Intravenous Given 08/23/17 1451)  ondansetron (ZOFRAN) injection 4 mg (4 mg Intravenous Given 08/23/17 1448)  HYDROmorphone (DILAUDID) injection 1 mg (1 mg Intravenous Given 08/23/17 1635)  Initial Impression / Assessment and Plan / ED Course  I have reviewed the triage vital signs and the nursing notes.  Pertinent labs & imaging results that were available during my care of the patient were reviewed by me and considered in my medical decision making (see chart for details).     Orthopedic evaluation (michael jeffery, PA-C working with dr. Everardo Pacific) and agree that there is concern for septic knee will plan for washout tomorrow because of cardiac history plan for admission to medicine. vanc and zosyn stated. Npo after midnight.   Final Clinical Impressions(s) / ED Diagnoses   Final diagnoses:  Acute pain of right knee    ED Discharge Orders    None        Nahjae Hoeg, Barbara Cower, MD 08/23/17 [phone]

## 2017-08-23 NOTE — ED Notes (Signed)
ED Provider at bedside. 

## 2017-08-23 NOTE — H&P (Addendum)
Family Medicine Teaching [facility] Admission History and Physical Service Pager: [phone]  Patient name: Larry Mason Medical record number: [id number] Date of birth: 08-20-1975 Age: 42 y.o. Gender: male  Primary Care Provider: Patient, No Pcp Per Consultants: orthopedics Code Status: full  Chief Complaint: right knee pain  Assessment and Plan: Larry Mason is a 42 y.o. male presenting with right knee pain found to have a septic right knee . PMH is significant for T2DM, HFrEF (20-25% EF), polysubstance abuse  Septic arthritis, right knee - Arthocentesis resulted in 143k WBCs, 89 neutorphils, turbid, no organisms. Patient afebrile but with warmth, swelling and ttp of right knee on exam. Patient afebrile with stable vitals. CRP elevated at 19.8 and patient has a leukocytosis to 15.3. Ortho consulted in ED and planning for I&D arthoscopy in the AM 4/12. - Admit to FPTS, attending Dr. Pollie Meyer - appreciate ortho recs - continue vanc/zosyn - NPO at [facility] for arthoscopy I&D - tylenol, tramadol PRN pain - monitor fever curve - vitals per unit - hold off IV fluids given euvolemic and poor EF, likely to get fluids during surgery  HFrEF - Cardiac echo 07/2014 notable for EF 20-25%, no wall motion abnormalities. Euvolemic on admission, at baseline 4 pillows to sleep with, no dyspnea. Reports he follows with Dr. Sharyn Lull and last saw him about 2 months ago. Home meds include lisinopril 10 mg daily, spiro 25 mg daily, coreg 25 mg BID.  - continue home meds - monitor BP - if UDS positive for cocaine will need to reevaluate coreg  Diabetes - home meds include glimepiride 1 mg daily. Last A1c documented in epic is 6.8 from 07/2014. Patient does not recall if there has been one more recently. - sliding scale insulin - q4H CBGs while NPO for procedure   Polysubstance abuse - cocaine and heroin noted on problem list. Concern with hx cocaine abuse and B-blocker.  - UDS   FEN/GI: NPO at  [facility] Prophylaxis: start ppx after surgery  Disposition: Admit FPTS for surgical intervention  History of Present Illness:  Larry Mason is a 42 y.o. male presenting with right knee pain, found to have septic arthritis on joint aspiration done in ED on 4/12. Patient was in his usual state of health until 3 days ago when he developed right knee pain. He reports the pain gradually worsened and he is now unable to bear weight on it. He went to the ED on 4/11 evening and joint aspiration was completed. It resulted turbid with 143K WBCs, 89 neutrophils and so patient was called to come back into the emergency department for ortho consult.  He endorses swelling and warmth in the knee.   For his other problems including HFrEF 20-25%EF he reports some left leg swelling which is at baseline, as well as sleeping with 4 pillows at night, no increased dyspnea on exertion over past few days. He also has a history of polysubstance abuse, with narcotics and suboxone last used 6-8 months ago. He reports he no longer uses narcotics or suboxone. He reports he previously used cocaine but it has been about one year. He reports 4pack/year history of tobacco, denies alcohol use.  Review Of Systems: Per HPI with the following additions: no fevers, no N/V/D/C, no urinary symptoms. No dyspnea or chest pain.  ROS  Patient Active Problem List   Diagnosis Date Noted  . Septic joint (HCC) 08/23/2017  . Acute pain of right knee   . Diabetes (HCC) 07/22/2014  .  Pain in the chest   . Chronic combined systolic and diastolic congestive heart failure (HCC)   . Chest pain 07/21/2014  . Essential hypertension   . Elevated troponin 07/20/2014  . Swelling of right lower extremity 07/20/2014  . Heroin abuse (HCC) 07/20/2014  . Cocaine abuse (HCC) 07/20/2014  . SOB (shortness of breath) 07/20/2014  . Dyspnea 07/20/2014    Past Medical History: Past Medical History:  Diagnosis Date  . Chronic kidney disease 1984  .  Cocaine abuse (HCC)   . Diabetes mellitus without complication (HCC)   . Heroin abuse (HCC)   . IVDU (intravenous drug user)   . Morbid obesity (HCC)   . Myocardial infarction [facility])     Past Surgical History: Past Surgical History:  Procedure Laterality Date  . KNEE SURGERY    . LEFT HEART CATHETERIZATION WITH CORONARY ANGIOGRAM N/A 07/22/2014   Procedure: LEFT HEART CATHETERIZATION WITH CORONARY ANGIOGRAM;  Surgeon: Rinaldo Cloud, MD;  Location: [facility] CATH LAB;  Service: Cardiovascular;  Laterality: N/A;    Social History: Social History   Tobacco Use  . Smoking status: Current Every Day Smoker    Packs/day: 0.50    Types: Cigarettes  Substance Use Topics  . Alcohol use: No  . Drug use: Yes    Comment: heroin last 6 weeks ago   Additional social history: 4 pack/year smoking hx, cocaine and heroin last reported use 8-12 months ago  Please also refer to relevant sections of EMR.  Family History: Family History  Problem Relation Age of Onset  . Diabetes Mellitus II Mother   . Hypertension Father    (If not completed, MUST add something in)  Allergies and Medications: No Known Allergies No current facility-administered medications on file prior to encounter.    Current Outpatient Medications on File Prior to Encounter  Medication Sig Dispense Refill  . carvedilol (COREG) 25 MG tablet Take 25 mg by mouth 2 (two) times daily with a meal.    . glimepiride (AMARYL) 1 MG tablet Take 1 tablet (1 mg total) by mouth daily with breakfast. 30 tablet 1  . ibuprofen (ADVIL,MOTRIN) 200 MG tablet Take 800 mg by mouth every 6 (six) hours as needed for moderate pain (pain and swelling).    Marland Kitchen lisinopril (PRINIVIL,ZESTRIL) 10 MG tablet Take 1 tablet (10 mg total) by mouth daily. 30 tablet 1  . spironolactone (ALDACTONE) 25 MG tablet Take 1 tablet (25 mg total) by mouth daily. 30 tablet 1    Objective: BP 130/76 (BP Location: Right Arm)   Pulse 89   Temp 98.4 F (36.9 C) (Oral)   Resp  18   Ht 5\' 7"  (1.702 m)   Wt 290 lb (131.5 kg)   SpO2 94%   BMI 45.42 kg/m  Exam: General: patient endorsing knee pain but nontoxic appearing, appropriate Eyes: PERRL, EOMI ENTM: MMM, no pharyngeal erythema Neck: supple Cardiovascular: distant heart sounds, RRR, no m/r/g Respiratory: CTA bil, no W/R/R Gastrointestinal: soft, nt, nd MSK: +warmth and ttp over right knee, +edema noted over right knee compared with left, no appreciable redness. Sensation and distal pulses intact Derm: no rashes or lesions, no puncture wounds appreciated Neuro: CN II- XII grossly intact Psych: AAOx3, appropriate  Labs and Imaging: CBC BMET  Recent Labs  Lab 08/23/17 1428  WBC 15.3*  HGB 10.6*  HCT 34.5*  PLT 340   Recent Labs  Lab 08/23/17 1428  NA 134*  K 4.2  CL 96*  CO2 28  BUN 16  CREATININE 0.61  GLUCOSE 137*  CALCIUM 9.1     Dg Knee Complete 4 Views Right  Result Date: 08/23/2017 CLINICAL DATA:  RIGHT knee pain and swelling for 2 days, no known injury EXAM: RIGHT KNEE - COMPLETE 4+ VIEW COMPARISON:  MR RIGHT knee 03/22/2007 FINDINGS: Low normal osseous mineralization. Tricompartmental joint space narrowing and spur formation. Bone-on-bone appearance at medial compartment. No acute fracture, dislocation, or bone destruction. Probable calcified loose body posteriorly. Large joint effusion. Remaining soft tissues unremarkable. 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 No definite acute bony abnormalities. Electronically Signed   By: Ulyses Southward M.D.   On: 08/23/2017 15:35     Howard Pouch, MD 08/23/2017, 5:48 PM PGY-2, [place] [facility] FPTS Intern pager: [phone], text pages welcome

## 2017-08-23 NOTE — ED Triage Notes (Signed)
Pt c/o right knee pain. Pt has hx of multiple surgeries on knee. Pt denies recent injury

## 2017-08-23 NOTE — ED Notes (Addendum)
Pt. Was called this am and asked to come back to the ED per Dr. Rosalia Hammers to have rt. Knee reevaluated for possible joint infection. Pt. Was seen at [facility] yesterday and has his rt. Knee drained.

## 2017-08-23 NOTE — Anesthesia Preprocedure Evaluation (Addendum)
Anesthesia Evaluation  Patient identified by MRN, date of birth, ID band Patient awake    Reviewed: Allergy & Precautions, H&P , NPO status , Patient's Chart, lab work & pertinent test results, reviewed documented beta blocker date and time   Airway Mallampati: III  TM Distance: >3 FB Neck ROM: Full    Dental no notable dental hx. (+) Teeth Intact, Dental Advisory Given   Pulmonary Current Smoker,    Pulmonary exam normal breath sounds clear to auscultation       Cardiovascular Exercise Tolerance: Good hypertension, Pt. on medications and Pt. on home beta blockers + Past MI and +CHF   Rhythm:Regular Rate:Normal     Neuro/Psych negative neurological ROS  negative psych ROS   GI/Hepatic negative GI ROS, Neg liver ROS,   Endo/Other  diabetes, Type 2, Oral Hypoglycemic AgentsMorbid obesity  Renal/GU negative Renal ROS  negative genitourinary   Musculoskeletal  (+) Arthritis , Osteoarthritis,    Abdominal   Peds  Hematology negative hematology ROS (+)   Anesthesia Other Findings   Reproductive/Obstetrics negative OB ROS                            Anesthesia Physical Anesthesia Plan  ASA: IV  Anesthesia Plan: General   Post-op Pain Management:    Induction: Intravenous  PONV Risk Score and Plan: 2 and Ondansetron, Dexamethasone and Midazolam  Airway Management Planned: LMA  Additional Equipment:   Intra-op Plan:   Post-operative Plan: Extubation in OR  Informed Consent: I have reviewed the patients History and Physical, chart, labs and discussed the procedure including the risks, benefits and alternatives for the proposed anesthesia with the patient or authorized representative who has indicated his/her understanding and acceptance.   Dental advisory given  Plan Discussed with: CRNA  Anesthesia Plan Comments:         Anesthesia Quick Evaluation

## 2017-08-23 NOTE — Consult Note (Addendum)
Attestation:  I agree with below.  Patient has had an autograft ACL recon 20+ years ago and did reasonably but has a history of infection.  We plan for scope I&D of the knee.  He understands the specific risks including but not limited to continued infection, need for further procedures and postop OA.  His NPO status precluded him having timely surgery last night.  Plan for WBAT postop.  Drain for 24 hrs, cultures to be sent again from OR.  Reason for Consult:Right knee pain Referring Physician: J Mason  Larry Mason is an 42 y.o. male.  HPI: Larry Mason was in his usual state of health until he woke up Thursday morning with right knee pain. He denies any prior hx/o similar except when he's injured that knee. He denies any hx/o trauma or overuse. The pain and swelling gradually increased and he went to [facility] last night where he underwent joint aspiration which showed large WBC but no organisms or crystals. He returns to the ED today with continued pain. He is essentially unable to bear weight or move the leg. Denies fevers, chills, sweats. Had nausea once but no emesis.  Past Medical History:  Diagnosis Date  . Chronic kidney disease 1984  . Cocaine abuse (HCC)   . Diabetes mellitus without complication (HCC)   . Heroin abuse (HCC)   . IVDU (intravenous drug user)   . Morbid obesity (HCC)   . Myocardial infarction [facility])     Past Surgical History:  Procedure Laterality Date  . KNEE SURGERY    . LEFT HEART CATHETERIZATION WITH CORONARY ANGIOGRAM N/A 07/22/2014   Procedure: LEFT HEART CATHETERIZATION WITH CORONARY ANGIOGRAM;  Surgeon: Rinaldo Cloud, MD;  Location: [facility] CATH LAB;  Service: Cardiovascular;  Laterality: N/A;    Family History  Problem Relation Age of Onset  . Diabetes Mellitus II Mother   . Hypertension Father     Social History:  reports that he has been smoking cigarettes.  He has been smoking about 0.50 packs per day. He does not have any smokeless tobacco history on file.  He reports that he has current or past drug history. He reports that he does not drink alcohol.  Allergies: No Known Allergies  Medications: I have reviewed the patient's current medications.  Results for orders placed or performed during the hospital encounter of 08/23/17 (from the past 48 hour(s))  CBC with Differential     Status: Abnormal   Collection Time: 08/23/17  2:28 PM  Result Value Ref Range   WBC 15.3 (H) 4.0 - 10.5 K/uL   RBC 3.98 (L) 4.22 - 5.81 MIL/uL   Hemoglobin 10.6 (L) 13.0 - 17.0 g/dL   HCT 16.1 (L) 09.6 - 04.5 %   MCV 86.7 78.0 - 100.0 fL   MCH 26.6 26.0 - 34.0 pg   MCHC 30.7 30.0 - 36.0 g/dL   RDW 40.9 81.1 - 91.4 %   Platelets 340 150 - 400 K/uL   Neutrophils Relative % 78 %   Neutro Abs 11.9 (H) 1.7 - 7.7 K/uL   Lymphocytes Relative 13 %   Lymphs Abs 2.0 0.7 - 4.0 K/uL   Monocytes Relative 8 %   Monocytes Absolute 1.2 (H) 0.1 - 1.0 K/uL   Eosinophils Relative 1 %   Eosinophils Absolute 0.2 0.0 - 0.7 K/uL   Basophils Relative 0 %   Basophils Absolute 0.0 0.0 - 0.1 K/uL    Comment: Performed at [facility], [address] [place],  [place] [address]    No results found.  Review of Systems  Constitutional: Negative for chills, fever and weight loss.  HENT: Negative for ear discharge, ear pain, hearing loss and tinnitus.   Eyes: Negative for blurred vision, double vision, photophobia and pain.  Respiratory: Negative for cough, sputum production and shortness of breath.   Cardiovascular: Negative for chest pain.  Gastrointestinal: Positive for nausea. Negative for abdominal pain and vomiting.  Genitourinary: Negative for dysuria, flank pain, frequency and urgency.  Musculoskeletal: Positive for joint pain (Right knee). Negative for back pain, falls, myalgias and neck pain.  Neurological: Negative for dizziness, tingling, sensory change, focal weakness, loss of consciousness and headaches.  Endo/Heme/Allergies: Does not bruise/bleed easily.   Psychiatric/Behavioral: Negative for depression, memory loss and substance abuse. The patient is not nervous/anxious.    Blood pressure (!) 147/74, pulse 91, temperature 98.4 F (36.9 C), temperature source Oral, resp. rate 18, height 5\' 7"  (1.702 m), weight 131.5 kg (290 lb), SpO2 93 %. Physical Exam  Constitutional: He appears well-developed and well-nourished. No distress.  HENT:  Head: Normocephalic and atraumatic.  Eyes: Conjunctivae are normal. Right eye exhibits no discharge. Left eye exhibits no discharge. No scleral icterus.  Neck: Normal range of motion.  Cardiovascular: Normal rate and regular rhythm.  Respiratory: Effort normal. No respiratory distress.  Musculoskeletal:  RLE No traumatic wounds, ecchymosis, or rash  Knee mod TTP, PROM 170--145  No ankle effusion, mod knee effusion  Sens DPN, SPN, TN intact  Motor EHL, ext, flex, evers 5/5  DP 1+, PT 1+, No significant edema  Neurological: He is alert.  Skin: Skin is warm and dry. He is not diaphoretic.  Psychiatric: He has a normal mood and affect. His behavior is normal.    Assessment/Plan: Right knee pain -- Will need to treat as if septic joint given the high WBC. As pt ate recently will plan for I&D arthroscopy tomorrow morning. NPO after MN. IV abx with vanc/Zosyn or hospitalist preference until then.    Freeman Caldron, PA-C Orthopedic Surgery [phone] 08/23/2017, 3:20 PM

## 2017-08-23 NOTE — ED Provider Notes (Addendum)
[facility] Provider Note: [name], MD, FACEP  CSN: [id number] MRN: [id number] ARRIVAL: 08/23/17 at 0156 ROOM: MH01/MH01   CHIEF COMPLAINT  Knee Pain   HISTORY OF PRESENT ILLNESS  08/23/17 2:30 AM Larry Mason is a 42 y.o. male with a history of right knee surgeries.  He is here with pain and swelling in his right knee since yesterday morning.  He denies trauma to the knee.  There is no associated erythema or warmth.  He describes the pain as severe, especially when trying to move his knee.  He has limited range of motion of that knee.  He has taken ibuprofen without adequate relief.   Past Medical History:  Diagnosis Date  . Chronic kidney disease 1984  . Cocaine abuse (HCC)   . Diabetes mellitus without complication (HCC)   . Heroin abuse (HCC)   . IVDU (intravenous drug user)   . Morbid obesity (HCC)   . Myocardial infarction [facility])     Past Surgical History:  Procedure Laterality Date  . KNEE SURGERY    . LEFT HEART CATHETERIZATION WITH CORONARY ANGIOGRAM N/A 07/22/2014   Procedure: LEFT HEART CATHETERIZATION WITH CORONARY ANGIOGRAM;  Surgeon: Rinaldo Cloud, MD;  Location: [facility] CATH LAB;  Service: Cardiovascular;  Laterality: N/A;    Family History  Problem Relation Age of Onset  . Diabetes Mellitus II Mother   . Hypertension Father     Social History   Tobacco Use  . Smoking status: Current Every Day Smoker    Packs/day: 0.50    Types: Cigarettes  Substance Use Topics  . Alcohol use: No  . Drug use: Yes    Comment: heroin last 6 weeks ago    Prior to Admission medications   Medication Sig Start Date End Date Taking? Authorizing Provider  carvedilol (COREG) 25 MG tablet Take 25 mg by mouth daily.   Yes [provider]  carvedilol (COREG) 25 MG tablet Take 25 mg by mouth 2 (two) times daily with a meal.   Yes [provider]  glimepiride (AMARYL) 1 MG tablet Take 1 tablet (1 mg total) by mouth daily with breakfast.  07/22/14  Yes Vassie Loll, MD  lisinopril (PRINIVIL,ZESTRIL) 10 MG tablet Take 1 tablet (10 mg total) by mouth daily. 07/22/14  Yes Vassie Loll, MD  spironolactone (ALDACTONE) 25 MG tablet Take 1 tablet (25 mg total) by mouth daily. 07/22/14  Yes Vassie Loll, MD  ibuprofen (ADVIL,MOTRIN) 200 MG tablet Take 800 mg by mouth every 6 (six) hours as needed for moderate pain (pain and swelling).    [provider]    Allergies Patient has no known allergies.   REVIEW OF SYSTEMS  Negative except as noted here or in the History of Present Illness.   PHYSICAL EXAMINATION  Initial Vital Signs Blood pressure (!) 155/93, pulse (!) 102, temperature 98.6 F (37 C), temperature source Oral, resp. rate 20, height 5\' 7"  (1.702 m), weight 131.5 kg (290 lb), SpO2 93 %.  Examination General: Well-developed, well-nourished male in no acute distress; appearance consistent with age of record HENT: normocephalic; atraumatic Eyes: Normal appearance Neck: supple Heart: regular rate and rhythm Lungs: clear to auscultation bilaterally Abdomen: soft; nondistended; nontender; bowel sounds present Extremities: No deformity; pulses normal; right knee with large, tender effusion and pain on attempted passive range of motion, range of motion significantly limited, no erythema or warmth Neurologic: Awake, alert and oriented; motor function intact in all extremities and symmetric; no facial droop Skin:  Warm and dry Psychiatric: Normal mood and affect   RESULTS  Summary of this visit's results, reviewed by myself:   EKG Interpretation  Date/Time:    Ventricular Rate:    PR Interval:    QRS Duration:   QT Interval:    QTC Calculation:   R Axis:     Text Interpretation:        Laboratory Studies: No results found for this or any previous visit (from the past 24 hour(s)). Imaging Studies: No results found.  ED COURSE  Nursing notes and initial vitals signs, including pulse oximetry,  reviewed.  Vitals:   08/23/17 0207 08/23/17 0210 08/23/17 0211  BP:   (!) 155/93  Pulse:   (!) 102  Resp:   20  Temp:  98.6 F (37 C)   TempSrc:  Oral   SpO2:   93%  Weight: 131.5 kg (290 lb)    Height: 5\' 7"  (1.702 m)      PROCEDURES   ARTHROCENTESIS After verbal consent was obtained the right knee was prepped and draped in the usual sterile fashion.  The skin overlying the medial aspect of the joint space was anesthetized with 2 mL of 2% lidocaine with epinephrine.  An 18-gauge needle was then entered into the joint space using a medial approach and approximately 65 mL of cloudy, pale yellow fluid was obtained.  The patient tolerated this well and there were no immediate complications.  Fluid has been sent for cell count, differential and crystal analysis as well as culture and Gram stain.   He is requesting referral to orthopedics.  We will follow-up on lab results and take appropriate steps should show evidence of infection or gout.  I have a low index of suspicion for septic joint given lack of erythema or warmth.  The patient has a history of narcotic abuse and it is not appropriate to prescribe narcotics in this context nor does the patient desire any.  He states he will continue to use ibuprofen.  ED DIAGNOSES     ICD-10-CM   1. Effusion of right knee M25.461        Aanshi Batchelder, Jonny Ruiz, MD 08/23/17 0258    Paula Libra, MD 08/23/17 0300

## 2017-08-23 NOTE — ED Notes (Signed)
Admitting md at bedside

## 2017-08-23 NOTE — H&P (View-Only) (Signed)
Attestation:  I agree with below.  Patient has had an autograft ACL recon 20+ years ago and did reasonably but has a history of infection.  We plan for scope I&D of the knee.  He understands the specific risks including but not limited to continued infection, need for further procedures and postop OA.  His NPO status precluded him having timely surgery last night.  Plan for WBAT postop.  Drain for 24 hrs, cultures to be sent again from OR.  Reason for Consult:Right knee pain Referring Physician: J Mesner  Larry Mason is an 42 y.o. male.  HPI: Larry Mason was in his usual state of health until he woke up Thursday morning with right knee pain. He denies any prior hx/o similar except when he's injured that knee. He denies any hx/o trauma or overuse. The pain and swelling gradually increased and he went to [facility] last night where he underwent joint aspiration which showed large WBC but no organisms or crystals. He returns to the ED today with continued pain. He is essentially unable to bear weight or move the leg. Denies fevers, chills, sweats. Had nausea once but no emesis.  Past Medical History:  Diagnosis Date  . Chronic kidney disease 1984  . Cocaine abuse (HCC)   . Diabetes mellitus without complication (HCC)   . Heroin abuse (HCC)   . IVDU (intravenous drug user)   . Morbid obesity (HCC)   . Myocardial infarction [facility])     Past Surgical History:  Procedure Laterality Date  . KNEE SURGERY    . LEFT HEART CATHETERIZATION WITH CORONARY ANGIOGRAM N/A 07/22/2014   Procedure: LEFT HEART CATHETERIZATION WITH CORONARY ANGIOGRAM;  Surgeon: Rinaldo Cloud, MD;  Location: [facility] CATH LAB;  Service: Cardiovascular;  Laterality: N/A;    Family History  Problem Relation Age of Onset  . Diabetes Mellitus II Mother   . Hypertension Father     Social History:  reports that he has been smoking cigarettes.  He has been smoking about 0.50 packs per day. He does not have any smokeless tobacco history on file.  He reports that he has current or past drug history. He reports that he does not drink alcohol.  Allergies: No Known Allergies  Medications: I have reviewed the patient's current medications.  Results for orders placed or performed during the hospital encounter of 08/23/17 (from the past 48 hour(s))  CBC with Differential     Status: Abnormal   Collection Time: 08/23/17  2:28 PM  Result Value Ref Range   WBC 15.3 (H) 4.0 - 10.5 K/uL   RBC 3.98 (L) 4.22 - 5.81 MIL/uL   Hemoglobin 10.6 (L) 13.0 - 17.0 g/dL   HCT 16.1 (L) 09.6 - 04.5 %   MCV 86.7 78.0 - 100.0 fL   MCH 26.6 26.0 - 34.0 pg   MCHC 30.7 30.0 - 36.0 g/dL   RDW 40.9 81.1 - 91.4 %   Platelets 340 150 - 400 K/uL   Neutrophils Relative % 78 %   Neutro Abs 11.9 (H) 1.7 - 7.7 K/uL   Lymphocytes Relative 13 %   Lymphs Abs 2.0 0.7 - 4.0 K/uL   Monocytes Relative 8 %   Monocytes Absolute 1.2 (H) 0.1 - 1.0 K/uL   Eosinophils Relative 1 %   Eosinophils Absolute 0.2 0.0 - 0.7 K/uL   Basophils Relative 0 %   Basophils Absolute 0.0 0.0 - 0.1 K/uL    Comment: Performed at [facility], [address] [place],  [place] [address]    No results found.  Review of Systems  Constitutional: Negative for chills, fever and weight loss.  HENT: Negative for ear discharge, ear pain, hearing loss and tinnitus.   Eyes: Negative for blurred vision, double vision, photophobia and pain.  Respiratory: Negative for cough, sputum production and shortness of breath.   Cardiovascular: Negative for chest pain.  Gastrointestinal: Positive for nausea. Negative for abdominal pain and vomiting.  Genitourinary: Negative for dysuria, flank pain, frequency and urgency.  Musculoskeletal: Positive for joint pain (Right knee). Negative for back pain, falls, myalgias and neck pain.  Neurological: Negative for dizziness, tingling, sensory change, focal weakness, loss of consciousness and headaches.  Endo/Heme/Allergies: Does not bruise/bleed easily.   Psychiatric/Behavioral: Negative for depression, memory loss and substance abuse. The patient is not nervous/anxious.    Blood pressure (!) 147/74, pulse 91, temperature 98.4 F (36.9 C), temperature source Oral, resp. rate 18, height 5\' 7"  (1.702 m), weight 131.5 kg (290 lb), SpO2 93 %. Physical Exam  Constitutional: He appears well-developed and well-nourished. No distress.  HENT:  Head: Normocephalic and atraumatic.  Eyes: Conjunctivae are normal. Right eye exhibits no discharge. Left eye exhibits no discharge. No scleral icterus.  Neck: Normal range of motion.  Cardiovascular: Normal rate and regular rhythm.  Respiratory: Effort normal. No respiratory distress.  Musculoskeletal:  RLE No traumatic wounds, ecchymosis, or rash  Knee mod TTP, PROM 170--145  No ankle effusion, mod knee effusion  Sens DPN, SPN, TN intact  Motor EHL, ext, flex, evers 5/5  DP 1+, PT 1+, No significant edema  Neurological: He is alert.  Skin: Skin is warm and dry. He is not diaphoretic.  Psychiatric: He has a normal mood and affect. His behavior is normal.    Assessment/Plan: Right knee pain -- Will need to treat as if septic joint given the high WBC. As pt ate recently will plan for I&D arthroscopy tomorrow morning. NPO after MN. IV abx with vanc/Zosyn or hospitalist preference until then.    Freeman Caldron, PA-C Orthopedic Surgery [phone] 08/23/2017, 3:20 PM

## 2017-08-24 ENCOUNTER — Encounter (HOSPITAL_COMMUNITY)
Admission: EM | Disposition: A | Discharge status: Home Health | Payer: Self-pay | Source: Home / Self Care | Attending: Family Medicine

## 2017-08-24 ENCOUNTER — Inpatient Hospital Stay (HOSPITAL_COMMUNITY): Payer: Self-pay | Admitting: Anesthesiology

## 2017-08-24 DIAGNOSIS — F1910 Other psychoactive substance abuse, uncomplicated: Secondary | ICD-10-CM

## 2017-08-24 DIAGNOSIS — I10 Essential (primary) hypertension: Secondary | ICD-10-CM

## 2017-08-24 HISTORY — PX: KNEE ARTHROSCOPY: SHX127

## 2017-08-24 LAB — BASIC METABOLIC PANEL
Anion gap: 9 (ref 5–15)
BUN: 12 mg/dL (ref 6–20)
CALCIUM: 8.7 mg/dL — AB (ref 8.9–10.3)
CO2: 26 mmol/L (ref 22–32)
CREATININE: 0.64 mg/dL (ref 0.61–1.24)
Chloride: 101 mmol/L (ref 101–111)
GFR calc non Af Amer: >60 mL/min (ref >60)
Glucose, Bld: 108 mg/dL — ABNORMAL HIGH (ref 65–99)
Potassium: 4.3 mmol/L (ref 3.5–5.1)
SODIUM: 136 mmol/L (ref 135–145)

## 2017-08-24 LAB — GLUCOSE, CAPILLARY
GLUCOSE-CAPILLARY: 114 mg/dL — AB (ref 65–99)
GLUCOSE-CAPILLARY: 137 mg/dL — AB (ref 65–99)
GLUCOSE-CAPILLARY: 213 mg/dL — AB (ref 65–99)
Glucose-Capillary: 98 mg/dL (ref 65–99)
Glucose-Capillary: 122 mg/dL — ABNORMAL HIGH (ref 65–99)
Glucose-Capillary: 180 mg/dL — ABNORMAL HIGH (ref 65–99)

## 2017-08-24 LAB — CBC
HCT: 31.4 % — ABNORMAL LOW (ref 39.0–52.0)
Hemoglobin: 9.4 g/dL — ABNORMAL LOW (ref 13.0–17.0)
MCH: 26.3 pg (ref 26.0–34.0)
MCHC: 29.9 g/dL — AB (ref 30.0–36.0)
MCV: 88 fL (ref 78.0–100.0)
PLATELETS: 257 10*3/uL (ref 150–400)
RBC: 3.57 MIL/uL — ABNORMAL LOW (ref 4.22–5.81)
RDW: 14.7 % (ref 11.5–15.5)
WBC: 13.6 10*3/uL — AB (ref 4.0–10.5)

## 2017-08-24 LAB — PROTIME-INR
INR: 1.15
PROTHROMBIN TIME: 14.6 s (ref 11.4–15.2)

## 2017-08-24 LAB — RAPID URINE DRUG SCREEN, HOSP PERFORMED
Amphetamines: NOT DETECTED
Barbiturates: NOT DETECTED
Benzodiazepines: NOT DETECTED
COCAINE: POSITIVE — AB
OPIATES: NOT DETECTED
Tetrahydrocannabinol: NOT DETECTED

## 2017-08-24 LAB — HIV ANTIBODY (ROUTINE TESTING W REFLEX): HIV SCREEN 4TH GENERATION: NONREACTIVE

## 2017-08-24 LAB — MRSA PCR SCREENING: MRSA BY PCR: NEGATIVE

## 2017-08-24 SURGERY — ARTHROSCOPY, KNEE
Anesthesia: General | Laterality: Right

## 2017-08-24 MED ORDER — HYDROMORPHONE HCL 2 MG/ML IJ SOLN
1.0000 mg | Freq: Once | INTRAMUSCULAR | Status: AC
  Administered 2017-08-24: 1 mg via INTRAVENOUS
  Filled 2017-08-24: qty 1

## 2017-08-24 MED ORDER — PROPOFOL 10 MG/ML IV BOLUS
INTRAVENOUS | Status: DC | PRN
  Administered 2017-08-24: 120 mg via INTRAVENOUS

## 2017-08-24 MED ORDER — SODIUM CHLORIDE 0.9 % IR SOLN
Status: DC | PRN
  Administered 2017-08-24: 4000 mL

## 2017-08-24 MED ORDER — PROPOFOL 10 MG/ML IV BOLUS
INTRAVENOUS | Status: AC
  Filled 2017-08-24: qty 20

## 2017-08-24 MED ORDER — BUPIVACAINE-EPINEPHRINE (PF) 0.25% -1:200000 IJ SOLN
INTRAMUSCULAR | Status: AC
  Filled 2017-08-24: qty 30

## 2017-08-24 MED ORDER — ACETAMINOPHEN 10 MG/ML IV SOLN
INTRAVENOUS | Status: DC | PRN
  Administered 2017-08-24: 1000 mg via INTRAVENOUS

## 2017-08-24 MED ORDER — FENTANYL CITRATE (PF) 250 MCG/5ML IJ SOLN
INTRAMUSCULAR | Status: AC
  Filled 2017-08-24: qty 5

## 2017-08-24 MED ORDER — HYDROMORPHONE HCL 1 MG/ML IJ SOLN
INTRAMUSCULAR | Status: AC
  Administered 2017-08-24: 0.5 mg via INTRAVENOUS
  Filled 2017-08-24: qty 1

## 2017-08-24 MED ORDER — MIDAZOLAM HCL 2 MG/2ML IJ SOLN
INTRAMUSCULAR | Status: DC | PRN
  Administered 2017-08-24: 2 mg via INTRAVENOUS

## 2017-08-24 MED ORDER — BUPIVACAINE-EPINEPHRINE 0.5% -1:200000 IJ SOLN
INTRAMUSCULAR | Status: DC | PRN
  Administered 2017-08-24: 20 mL

## 2017-08-24 MED ORDER — MIDAZOLAM HCL 2 MG/2ML IJ SOLN
INTRAMUSCULAR | Status: AC
  Filled 2017-08-24: qty 2

## 2017-08-24 MED ORDER — ONDANSETRON HCL 4 MG/2ML IJ SOLN
INTRAMUSCULAR | Status: DC | PRN
  Administered 2017-08-24: 4 mg via INTRAVENOUS

## 2017-08-24 MED ORDER — ASPIRIN EC 81 MG PO TBEC
81.0000 mg | DELAYED_RELEASE_TABLET | Freq: Every day | ORAL | Status: DC
  Administered 2017-08-24 – 2017-08-29 (×5): 81 mg via ORAL
  Filled 2017-08-24 (×5): qty 1

## 2017-08-24 MED ORDER — 0.9 % SODIUM CHLORIDE (POUR BTL) OPTIME
TOPICAL | Status: DC | PRN
  Administered 2017-08-24: 500 mL

## 2017-08-24 MED ORDER — ACETAMINOPHEN 10 MG/ML IV SOLN
INTRAVENOUS | Status: AC
  Filled 2017-08-24: qty 100

## 2017-08-24 MED ORDER — LACTATED RINGERS IV SOLN
INTRAVENOUS | Status: DC | PRN
  Administered 2017-08-24: 07:00:00 via INTRAVENOUS

## 2017-08-24 MED ORDER — HYDROMORPHONE HCL 1 MG/ML IJ SOLN
0.2500 mg | INTRAMUSCULAR | Status: DC | PRN
  Administered 2017-08-24 (×4): 0.5 mg via INTRAVENOUS

## 2017-08-24 MED ORDER — FENTANYL CITRATE (PF) 250 MCG/5ML IJ SOLN
INTRAMUSCULAR | Status: DC | PRN
  Administered 2017-08-24: 25 ug via INTRAVENOUS
  Administered 2017-08-24 (×2): 50 ug via INTRAVENOUS
  Administered 2017-08-24 (×2): 25 ug via INTRAVENOUS
  Administered 2017-08-24: 50 ug via INTRAVENOUS
  Administered 2017-08-24: 25 ug via INTRAVENOUS

## 2017-08-24 MED ORDER — LIDOCAINE 2% (20 MG/ML) 5 ML SYRINGE
INTRAMUSCULAR | Status: DC | PRN
  Administered 2017-08-24: 60 mg via INTRAVENOUS

## 2017-08-24 MED ORDER — LACTATED RINGERS IV SOLN
INTRAVENOUS | Status: DC
  Administered 2017-08-27 (×2): via INTRAVENOUS

## 2017-08-24 SURGICAL SUPPLY — 49 items
BANDAGE ACE 6X5 VEL STRL LF (GAUZE/BANDAGES/DRESSINGS) ×3 IMPLANT
BANDAGE ESMARK 6X9 LF (GAUZE/BANDAGES/DRESSINGS) IMPLANT
BLADE CLIPPER SURG (BLADE) IMPLANT
BLADE GREAT WHITE 4.2 (BLADE) IMPLANT
BLADE GREAT WHITE 4.2MM (BLADE)
BLADE SHAVER BONE 5.0MM X 13CM (MISCELLANEOUS) ×1
BLADE SHAVER BONE 5.0X13 (MISCELLANEOUS) ×2 IMPLANT
BNDG ESMARK 6X9 LF (GAUZE/BANDAGES/DRESSINGS)
CLOSURE STERI-STRIP 1/2X4 (GAUZE/BANDAGES/DRESSINGS) ×1
CLSR STERI-STRIP ANTIMIC 1/2X4 (GAUZE/BANDAGES/DRESSINGS) ×2 IMPLANT
COVER SURGICAL LIGHT HANDLE (MISCELLANEOUS) ×3 IMPLANT
CUFF TOURNIQUET SINGLE 34IN LL (TOURNIQUET CUFF) IMPLANT
DRAPE ARTHROSCOPY W/POUCH 114 (DRAPES) ×3 IMPLANT
DRAPE PROXIMA HALF (DRAPES) ×3 IMPLANT
DRAPE U-SHAPE 47X51 STRL (DRAPES) ×3 IMPLANT
DRSG PAD ABDOMINAL 8X10 ST (GAUZE/BANDAGES/DRESSINGS) IMPLANT
DURAPREP 26ML APPLICATOR (WOUND CARE) ×3 IMPLANT
EVACUATOR 1/8 PVC DRAIN (DRAIN) ×3 IMPLANT
FACESHIELD WRAPAROUND (MASK) ×3 IMPLANT
GAUZE SPONGE 4X4 12PLY STRL (GAUZE/BANDAGES/DRESSINGS) ×3 IMPLANT
GAUZE XEROFORM 1X8 LF (GAUZE/BANDAGES/DRESSINGS) ×3 IMPLANT
GLOVE BIOGEL PI IND STRL 8 (GLOVE) ×1 IMPLANT
GLOVE BIOGEL PI INDICATOR 8 (GLOVE) ×2
GLOVE ECLIPSE 8.0 STRL XLNG CF (GLOVE) ×6 IMPLANT
GOWN STRL REUS W/ TWL LRG LVL3 (GOWN DISPOSABLE) ×2 IMPLANT
GOWN STRL REUS W/ TWL XL LVL3 (GOWN DISPOSABLE) ×2 IMPLANT
GOWN STRL REUS W/TWL LRG LVL3 (GOWN DISPOSABLE) ×4
GOWN STRL REUS W/TWL XL LVL3 (GOWN DISPOSABLE) ×4
KIT TURNOVER KIT B (KITS) ×3 IMPLANT
MANIFOLD NEPTUNE II (INSTRUMENTS) IMPLANT
NS IRRIG 1000ML POUR BTL (IV SOLUTION) IMPLANT
PACK ARTHROSCOPY DSU (CUSTOM PROCEDURE TRAY) ×3 IMPLANT
PAD ABD 8X10 STRL (GAUZE/BANDAGES/DRESSINGS) ×3 IMPLANT
PAD ARMBOARD 7.5X6 YLW CONV (MISCELLANEOUS) ×6 IMPLANT
PAD CAST 4YDX4 CTTN HI CHSV (CAST SUPPLIES) ×1 IMPLANT
PADDING CAST COTTON 4X4 STRL (CAST SUPPLIES) ×2
PADDING CAST COTTON 6X4 STRL (CAST SUPPLIES) IMPLANT
PROBE BIPOLAR ATHRO 135MM 90D (MISCELLANEOUS) ×3 IMPLANT
SET ARTHROSCOPY TUBING (MISCELLANEOUS) ×2
SET ARTHROSCOPY TUBING LN (MISCELLANEOUS) ×1 IMPLANT
SPONGE LAP 4X18 X RAY DECT (DISPOSABLE) ×3 IMPLANT
SUT ETHILON 2 0 FS 18 (SUTURE) IMPLANT
SUT MNCRL AB 4-0 PS2 18 (SUTURE) ×3 IMPLANT
TOWEL OR 17X24 6PK STRL BLUE (TOWEL DISPOSABLE) ×9 IMPLANT
TOWEL OR 17X26 10 PK STRL BLUE (TOWEL DISPOSABLE) ×3 IMPLANT
TUBE CONNECTING 12'X1/4 (SUCTIONS) ×1
TUBE CONNECTING 12X1/4 (SUCTIONS) ×2 IMPLANT
TUBING ARTHROSCOPY IRRIG 16FT (MISCELLANEOUS) ×3 IMPLANT
WATER STERILE IRR 1000ML POUR (IV SOLUTION) ×3 IMPLANT

## 2017-08-24 NOTE — Plan of Care (Signed)

## 2017-08-24 NOTE — Interval H&P Note (Signed)
Discussed case, risks and benefits with patient again.  All questions answered, no change to history.  Larry Storey MD  

## 2017-08-24 NOTE — Anesthesia Postprocedure Evaluation (Signed)
Anesthesia Post Note  Patient: Larry Mason  Procedure(s) Performed: ARTHROSCOPY KNEE IRRIGATION AND DEBRIDEMENT (Right )     Patient location during evaluation: PACU Anesthesia Type: General Level of consciousness: awake and alert Pain management: pain level controlled Vital Signs Assessment: post-procedure vital signs reviewed and stable Respiratory status: spontaneous breathing, nonlabored ventilation and respiratory function stable Cardiovascular status: blood pressure returned to baseline and stable Postop Assessment: no apparent nausea or vomiting Anesthetic complications: no    Last Vitals:  Vitals:   08/24/17 0935 08/24/17 1003  BP:  (!) 149/77  Pulse:  91  Resp:  14  Temp:  36.8 C  SpO2: 94% 93%    Last Pain:  Vitals:   08/24/17 1003  TempSrc: Axillary  PainSc: 8                  Keland Peyton,W. EDMOND

## 2017-08-24 NOTE — Op Note (Addendum)
Orthopaedic Surgery Operative Note (CSN: [id number])  Larry Mason  1975-12-16 Date of Surgery: 08/24/2017   Diagnoses:  Right septic knee  Procedure: R knee arthroscopic washout septic knee 10932 R knee arthroscopy for synovial biopsy 29870 R knee chondroplasty 35573    Operative Finding Successful completion of planned procedure.  Gross purulence noted within the joint, minimal injection of tissue and inflammatory response.  Tri compartmental grade 4 bipolar areas of cartilage loss, obvious meniscal insufficiency medial compartment primarily.   ACL incompetent and knee with 10 degree flexion contracture at baseline.    Post-operative plan: The patient will be WBAT w drain for 24 hrs likely.  The patient will be admitted back to medicien.  DVT prophylaxis aspirin 81mg  qd recommended.  Pain control with PRN pain medication preferring oral medicines.  Follow up plan will be scheduled in approximately 7 days for incision check  Post-Op Diagnosis: Same Surgeons:Primary: Bjorn Pippin, MD Assistants:None Location: [facility] OR ROOM 07 Anesthesia: General Antibiotics: Ancef 3g preop Tourniquet time: 26 Estimated Blood Loss: minimal Complications: None Specimens: Two purulent fluid samples and small amounts of tissue for culture Implants: * No implants in log *  Indications for Surgery:   Larry Mason is a 42 y.o. male with history of substance abuse and R knee pain for 2 days.  Noted on aspiration by ER to have >140k nucleated cells.  Benefits and risks of operative and nonoperative management were discussed prior to surgery with patient/guardian(s) and informed consent form was completed.  Specific risks including infection, need for additional surgery, continued infection, worsening postop arthritis.   Procedure:   The patient was identified properly. Informed consent was obtained and the surgical site was marked. The patient was taken up to suite where general anesthesia was  induced. The patient was placed in the supine position with a post against the surgical leg and a nonsterile tourniquet applied. The surgical leg was then prepped and draped usual sterile fashion.  A standard surgical timeout was performed.  2 standard anterior portals were made and diagnostic arthroscopy performed. Please note the findings as noted above.  A superolateral portal was used for outflow.  We ran 10L of fluid through the knee and performed a subtotal synovectomy with the shaver.  We took fluid samples for culture x 2 and a synovial biopsy was performed for culture.  We then performed a chondroplasty of the loose edges of the patellofemoral compartment and medial femoral condyle as we were concerned the patient may have continued mechanical symptoms without this and could harbor infection as these areas were nearly loose bodies   Incisions closed with absorbable suture and a drain was placed under arthroscopic guidance with 14 holes left on the drain through the superolaetral portal.  The patient was awoken from general anesthesia and taken to the PACU in stable condition without complication.

## 2017-08-24 NOTE — Transfer of Care (Signed)
Immediate Anesthesia Transfer of Care Note  Patient: Larry Mason  Procedure(s) Performed: ARTHROSCOPY KNEE IRRIGATION AND DEBRIDEMENT (Right )  Patient Location: PACU  Anesthesia Type:General  Level of Consciousness: awake, alert  and oriented  Airway & Oxygen Therapy: Patient Spontanous Breathing and Patient connected to nasal cannula oxygen  Post-op Assessment: Report given to RN and Post -op Vital signs reviewed and stable  Post vital signs: Reviewed and stable  Last Vitals:  Vitals Value Taken Time  BP 148/96 08/24/2017  8:42 AM  Temp    Pulse 91 08/24/2017  8:42 AM  Resp    SpO2 96 % 08/24/2017  8:42 AM  Vitals shown include unvalidated device data.  Last Pain:  Vitals:   08/24/17 0510  TempSrc: Oral  PainSc:          Complications: No apparent anesthesia complications

## 2017-08-24 NOTE — Anesthesia Procedure Notes (Signed)
Procedure Name: LMA Insertion Date/Time: 08/24/2017 7:37 AM Performed by: Dairl Ponder, CRNA Pre-anesthesia Checklist: Patient identified, Emergency Drugs available, Suction available, Patient being monitored and Timeout performed Patient Re-evaluated:Patient Re-evaluated prior to induction Oxygen Delivery Method: Circle system utilized Preoxygenation: Pre-oxygenation with 100% oxygen Induction Type: IV induction LMA: LMA inserted LMA Size: 4.0 Number of attempts: 1 Placement Confirmation: positive ETCO2 and breath sounds checked- equal and bilateral Tube secured with: Tape Dental Injury: Teeth and Oropharynx as per pre-operative assessment

## 2017-08-24 NOTE — Progress Notes (Signed)
Family Medicine Teaching Service Daily Progress Note Intern Pager: [phone]  Patient name: Larry Mason Medical record number: [id number] Date of birth: 16-Oct-1975 Age: 42 y.o. Gender: male  Primary Care Provider: Patient, No Pcp Per Consultants: orthopedics Code Status: full  Pt Overview and Major Events to Date:  4/12 - admit FPTS 4/13 plan for I&D/arthoscopy by ortho  Assessment and Plan: LETROY Mason is a 42 y.o. male presenting with right knee pain found to have a septic right knee . PMH is significant for T2DM, HFrEF (20-25% EF), polysubstance abuse  Septic arthritis, right knee - Arthocentesis resulted in 143k WBCs, 89 neutorphils, turbid, no organisms. Patient remains afebrile.R knee with edema, warmth, no redness.  Patient afebrile with stable vitals. Leukocytosis on admit 15.3 >> 13.6. Ortho planning for I&D arthoscopy this AM. - follow up ortho recs - continue day #2 vanc/zosyn (4/12 - ) - tylenol, tramadol PRN pain - monitor fever curve - vitals per unit - hold off IV fluids given euvolemic and poor EF, likely to get fluids during surgery  HFrEF - Cardiac echo 07/2014 notable for EF 20-25%, no wall motion abnormalities. Euvolemic. Reports he follows with Dr. Sharyn Lull and last saw him about 2 months ago. Home meds include lisinopril 10 mg daily, spiro 25 mg daily, coreg 25 mg BID.  - continue home meds - monitor BP - UDS positive for cocaine >> need to reevaluate coreg  Diabetes - home meds include glimepiride 1 mg daily. Last A1c documented in epic is 6.8 from 07/2014. Patient does not recall if there has been one more recently. CBGs overnight 137, 218, 122. - sliding scale insulin - q4H CBGs while NPO for procedure   Polysubstance abuse - cocaine and heroin noted on problem list. Concern with hx cocaine abuse and B-blocker.  - UDS +cocaine  FEN/GI: NPO at MN Prophylaxis: start ppx after surgery  Disposition: monitor inpatient for ortho  procedure  Subjective:  Patient seen and examined in pre-op area. He endorses continued right knee pain but otherwise no complaints, no acute events overnight.  Objective: Temp:  [98.4 F (36.9 C)-99.6 F (37.6 C)] 99.6 F (37.6 C) (04/13 0510) Pulse Rate:  [87-91] 87 (04/13 0510) Resp:  [18] 18 (04/12 1719) BP: (129-147)/(66-95) 129/67 (04/13 0510) SpO2:  [88 %-94 %] 88 % (04/13 0510) Weight:  [290 lb (131.5 kg)] 290 lb (131.5 kg) (04/12 1350) Physical Exam: General: NAD, rests in bed Cardiovascular: RRR, no m/r/g Respiratory: CTA bil, no W/R/R Abdomen: soft nt nd Extremities: +edema, warmth noted over right knee, no erythema. +ttp over right knee.  Laboratory: Recent Labs  Lab 08/23/17 1428 08/24/17 0554  WBC 15.3* 13.6*  HGB 10.6* 9.4*  HCT 34.5* 31.4*  PLT 340 257   Recent Labs  Lab 08/23/17 1428  NA 134*  K 4.2  CL 96*  CO2 28  BUN 16  CREATININE 0.61  CALCIUM 9.1  GLUCOSE 137*   UDS +cocaine CRP 19  R knee arthocentesis resulted in 143k WBCs, 89 neutorphils, turbid, no organisms.  Imaging/Diagnostic Tests: Dg Knee Complete 4 Views Right  Result Date: 08/23/2017 CLINICAL DATA:  RIGHT knee pain and swelling for 2 days, no known injury EXAM: RIGHT KNEE - COMPLETE 4+ VIEW COMPARISON:  MR RIGHT knee 03/22/2007 FINDINGS: Low normal osseous mineralization. Tricompartmental joint space narrowing and spur formation. Bone-on-bone appearance at medial compartment. No acute fracture, dislocation, or bone destruction. Probable calcified loose body posteriorly. Large joint effusion. Remaining soft tissues unremarkable. 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 No definite acute bony abnormalities. Electronically Signed   By: Larry Mason M.D.   On: 08/23/2017 15:35     Howard Pouch, MD 08/24/2017, 7:04 AM PGY-2, [facility] FPTS Intern pager: [phone], text pages welcome

## 2017-08-25 ENCOUNTER — Encounter (HOSPITAL_COMMUNITY): Payer: Self-pay | Admitting: Orthopaedic Surgery

## 2017-08-25 LAB — CBC
HEMATOCRIT: 29 % — AB (ref 39.0–52.0)
HEMATOCRIT: 31.7 % — AB (ref 39.0–52.0)
HEMOGLOBIN: 8.6 g/dL — AB (ref 13.0–17.0)
Hemoglobin: 9.5 g/dL — ABNORMAL LOW (ref 13.0–17.0)
MCH: 26 pg (ref 26.0–34.0)
MCH: 26.3 pg (ref 26.0–34.0)
MCHC: 29.7 g/dL — AB (ref 30.0–36.0)
MCHC: 30 g/dL (ref 30.0–36.0)
MCV: 87.6 fL (ref 78.0–100.0)
MCV: 87.8 fL (ref 78.0–100.0)
Platelets: 318 10*3/uL (ref 150–400)
Platelets: 345 10*3/uL (ref 150–400)
RBC: 3.31 MIL/uL — ABNORMAL LOW (ref 4.22–5.81)
RBC: 3.61 MIL/uL — ABNORMAL LOW (ref 4.22–5.81)
RDW: 14.9 % (ref 11.5–15.5)
RDW: 14.8 % (ref 11.5–15.5)
WBC: 12.8 10*3/uL — ABNORMAL HIGH (ref 4.0–10.5)
WBC: 10.7 10*3/uL — ABNORMAL HIGH (ref 4.0–10.5)

## 2017-08-25 LAB — BASIC METABOLIC PANEL
Anion gap: 10 (ref 5–15)
BUN: 10 mg/dL (ref 6–20)
CO2: 28 mmol/L (ref 22–32)
CREATININE: 0.73 mg/dL (ref 0.61–1.24)
Calcium: 8.3 mg/dL — ABNORMAL LOW (ref 8.9–10.3)
Chloride: 99 mmol/L — ABNORMAL LOW (ref 101–111)
GFR calc non Af Amer: >60 mL/min (ref >60)
GLUCOSE: 157 mg/dL — AB (ref 65–99)
Potassium: 3.8 mmol/L (ref 3.5–5.1)
Sodium: 137 mmol/L (ref 135–145)

## 2017-08-25 LAB — GLUCOSE, CAPILLARY
GLUCOSE-CAPILLARY: 177 mg/dL — AB (ref 65–99)
GLUCOSE-CAPILLARY: 232 mg/dL — AB (ref 65–99)
Glucose-Capillary: 151 mg/dL — ABNORMAL HIGH (ref 65–99)
Glucose-Capillary: 139 mg/dL — ABNORMAL HIGH (ref 65–99)

## 2017-08-25 MED ORDER — POLYETHYLENE GLYCOL 3350 17 G PO PACK: 17.0000 g | PACK | Freq: Every day | ORAL | Status: DC | PRN

## 2017-08-25 MED ORDER — ACETAMINOPHEN 325 MG PO TABS
650.0000 mg | ORAL_TABLET | Freq: Three times a day (TID) | ORAL | Status: DC
  Administered 2017-08-25 – 2017-08-29 (×12): 650 mg via ORAL
  Filled 2017-08-25 (×12): qty 2

## 2017-08-25 NOTE — Progress Notes (Signed)
ORTHOPAEDIC PROGRESS NOTE  s/p Procedure(s): ARTHROSCOPY KNEE IRRIGATION AND DEBRIDEMENT  SUBJECTIVE: Reports mild pain about operative site. No chest pain. No SOB. No nausea/vomiting. No other complaints.  OBJECTIVE: PE:RLE: drain in place, distal motor and sensory preserved, dressing CDI  Vitals:   08/24/17 2030 08/25/17 0516  BP: 129/69 (!) 151/78  Pulse: 86 92  Resp: 18 18  Temp: 99.2 F (37.3 C) 99.1 F (37.3 C)  SpO2: 92% (!) 89%     ASSESSMENT: Larry Mason is a 42 y.o. male doing well postoperatively.  Drain out tomorrow likely, ok for dc from ortho standpoint after that.  PLAN: Weightbearing: WBAT RLE Insicional and dressing care: drain out tomorrow, dressing change prn Orthopedic device(s): None Showering: after dressing off. VTE prophylaxis: Aspirin 81mg  qd 6 weeks Pain control: prn meds, minimize narcs Follow - up plan: 2 weeks for wound check Contact information:  Weekdays 8-5 Ramond Marrow MD [phone], After hours and holidays please check [email or web address] for group call information for [facility]

## 2017-08-25 NOTE — Progress Notes (Signed)
Family Medicine Teaching Service Daily Progress Note Intern Pager: [phone]  Patient name: Larry Mason Medical record number: [id number] Date of birth: 1976-02-22 Age: 42 y.o. Gender: male  Primary Care Provider: Patient, No Pcp Per Consultants: orthopedics Code Status: full  Pt Overview and Major Events to Date:  4/12 - admit FPTS 4/13 I&D/arthoscopy by ortho  Assessment and Plan: Larry Mason is a 42 y.o. male presenting with right knee pain found to have a septic right knee. PMH is significant for T2DM, HFrEF (20-25% EF), polysubstance abuse  Septic arthritis, right knee - S/p arthroscopy and washout 4/13. Patient had pain overnight requiring 1 mg IV dilaudid but says pain bearable this a.m. Arthocentesis 08/23/17 resulted in 143k WBCs, 89 neutorphils, turbid. Rare Serratia marcescens. Patient remains afebrile. R knee wrapped with bloody output of wound drain. Leukocytosis on admit 15.3 >> 12.8. Op note from 4/13 showed gross purulence, tricompartmental grade 4 cartilage loss, meniscal insufficiency of medial compartment and incompetent ACL with flexion contracture. Operative cultures pending. Hgb 8.6, down from 9.4 pre-op.  - Admitted to med surg - follow up ortho recs, appreciate care --> WBAT, will need wound recheck in 7 days, and continue drain for ~24 hrs - Discussed patient with Dr. Luciana Axe of ID. He informed that Serratia is like Staph, and does not feel like patient needs endocarditis workup with TEE. Can stop the vancomycin and hopefully can transition to oral antibiotic like ciprofloxacin for discharge once culture sensitivities return. - continue day #3 antibiotics with zosyn (4/12 - ) and will discontinue vanc (4/12-4/14 a.m.) per Dr. Ephriam Knuckles recommendation; narrow based on culture results as able - Will schedule tylenol 650 TID, continue tramadol PRN pain - Try to limit IV pain medication - monitor fever curve - vitals per unit - repeat CBC this evening  HFrEF -  Cardiac echo 07/2014 notable for EF 20-25%, no wall motion abnormalities. Euvolemic. Reports he follows with Dr. Sharyn Lull and last saw him about 2 months ago. Home meds include lisinopril 10 mg daily, spiro 25 mg daily, coreg 25 mg BID.  - continue home meds - monitor BP  Diabetes - home meds include glimepiride 1 mg daily. Last A1c documented in epic is 6.8 from 07/2014. Patient does not recall if there has been one more recently. CBGs overnight 180, 177. - sensitive sliding scale insulin with QAC/QHS CBGs   Polysubstance abuse - cocaine and heroin noted on problem list. Concern with hx cocaine abuse and B-blocker but attending discussed with patient 4/13. Patient adamant he would not use drugs while taking coreg. UDS +cocaine. - Limit IV pain medication as able  FEN/GI: Heart Healthy, carb modified Prophylaxis: asa per surgery, SCDs  Disposition: monitor inpatient for ortho procedure  Subjective:  Patient reports having tolerable pain this morning. He denies questions. He had a BM yesterday and has been eating fine.  Objective: Temp:  [98.3 F (36.8 C)-99.2 F (37.3 C)] 99.1 F (37.3 C) (04/14 0516) Pulse Rate:  [86-92] 92 (04/14 0516) Resp:  [14-18] 18 (04/14 0516) BP: (129-151)/(69-78) 151/78 (04/14 0516) SpO2:  [89 %-93 %] 89 % (04/14 0516) Physical Exam: General: NAD, sleeping in bed comfortably, easy to awaken, pleasant Cardiovascular: RRR, no m/r/g Respiratory: CTA bil, no W/R/R Abdomen: soft nt nd, +BS Extremities: Right LE in compression wrap with wound drain full of bloody output. Toes warm. Can wiggle toes.  Laboratory: Recent Labs  Lab 08/23/17 1428 08/24/17 0554 08/25/17 0732  WBC 15.3* 13.6* 12.8*  HGB 10.6* 9.4*  8.6*  HCT 34.5* 31.4* 29.0*  PLT 340 257 318   Recent Labs  Lab 08/23/17 1428 08/24/17 0554 08/25/17 0732  NA 134* 136 137  K 4.2 4.3 3.8  CL 96* 101 99*  CO2 28 26 28   BUN 16 12 10   CREATININE 0.61 0.64 0.73  CALCIUM 9.1 8.7* 8.3*   GLUCOSE 137* 108* 157*   UDS +cocaine CRP 19  R knee arthocentesis resulted in 143k WBCs, 89 neutorphils, turbid, no organisms.  Imaging/Diagnostic Tests: Dg Knee Complete 4 Views Right  Result Date: 08/23/2017 CLINICAL DATA:  RIGHT knee pain and swelling for 2 days, no known injury EXAM: RIGHT KNEE - COMPLETE 4+ VIEW COMPARISON:  MR RIGHT knee 03/22/2007 FINDINGS: Low normal osseous mineralization. Tricompartmental joint space narrowing and spur formation. Bone-on-bone appearance at medial compartment. No acute fracture, dislocation, or bone destruction. Probable calcified loose body posteriorly. Large joint effusion. Remaining soft tissues unremarkable. 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 No definite acute bony abnormalities. Electronically Signed   By: Ulyses Southward M.D.   On: 08/23/2017 15:35    Casey Burkitt, MD 08/25/2017, 9:40 AM PGY-3,  Family Medicine FPTS Intern pager: [phone], text pages welcome

## 2017-08-25 NOTE — Evaluation (Signed)
Physical Therapy Evaluation Patient Details Name: Larry Mason MRN: [id number] DOB: [birth year] Today's Date: 08/25/2017   History of Present Illness  Pt is a 42 yo male admitted through ED with left knee pain. Pt was found to have an infection in his right knee & underwent an I&D yesterday 08/24/17. PMH significant for CHF, polysubstance abuse (cocaine, heroin), Dm2, obesity, MI.   Clinical Impression  Pt presents with the above diagnosis and below deficits for therapy evaluation. Prior to admission, pt lived with his parents in a single level home. Pt was independent with all ADLS and IADLS. Pt plans to return home with his parents who are able to assist PRN. Pt will benefit from continued acute PT follow-up to address fall prevention, gait and stair negotiation training prior to discharge. Pt may not qualify for therapy following discharge and may benefit from family education as well.     Follow Up Recommendations Other (comment)(would reccommend HHPT or Outpatient follow-up. )    Equipment Recommendations  None recommended by PT    Recommendations for Other Services       Precautions / Restrictions Precautions Precautions: Fall Restrictions Weight Bearing Restrictions: No      Mobility  Bed Mobility Overal bed mobility: Modified Independent             General bed mobility comments: able to get EOB without assistance  Transfers Overall transfer level: Needs assistance Equipment used: Rolling walker (2 wheeled) Transfers: Sit to/from Stand Sit to Stand: Min guard         General transfer comment: Min gaurd for safety  Ambulation/Gait Ambulation/Gait assistance: Min guard Ambulation Distance (Feet): 10 Feet Assistive device: Crutches Gait Pattern/deviations: Step-to pattern;Antalgic;Decreased step length - left;Decreased stance time - right Gait velocity: decreased Gait velocity interpretation: <1.8 ft/sec, indicate of risk for recurrent falls General Gait  Details: pt with moderate antalgic gait, decreased step length RLE and increased knee flexion due to pain  Stairs            Wheelchair Mobility    Modified Rankin (Stroke Patients Only)       Balance Overall balance assessment: Mild deficits observed, not formally tested                                           Pertinent Vitals/Pain Pain Assessment: 0-10 Pain Score: 7  Pain Location: right knee Pain Descriptors / Indicators: Aching Pain Intervention(s): Limited activity within patient's tolerance;Monitored during session;Premedicated before session;Repositioned;Ice applied    Home Living Family/patient expects to be discharged to:: Private residence Living Arrangements: Parent Available Help at Discharge: Family Type of Home: House Home Access: Stairs to enter Entrance Stairs-Rails: None Secretary/administrator of Steps: 2 Home Layout: One level Home Equipment: Crutches      Prior Function Level of Independence: Independent               Hand Dominance   Dominant Hand: Right    Extremity/Trunk Assessment   Upper Extremity Assessment Upper Extremity Assessment: Overall WFL for tasks assessed    Lower Extremity Assessment Lower Extremity Assessment: RLE deficits/detail RLE Deficits / Details: post op pain and weakness with at least 2/5 grossly RLE       Communication   Communication: No difficulties  Cognition Arousal/Alertness: Awake/alert Behavior During Therapy: Anxious;Restless Overall Cognitive Status: Within Functional Limits for tasks assessed  General Comments General comments (skin integrity, edema, etc.): h/o polysubstance abuse. Had a fall in the bathroom this AM which he was able to get himself up from. Nursing staff and MD aware.     Exercises General Exercises - Lower Extremity Ankle Circles/Pumps: AROM;Both;20 reps;Supine Quad Sets: AROM;Right;10  reps;Supine   Assessment/Plan    PT Assessment Patient needs continued PT services  PT Problem List Decreased strength;Decreased activity tolerance;Decreased balance;Decreased mobility;Decreased knowledge of use of DME;Decreased safety awareness;Obesity;Pain       PT Treatment Interventions DME instruction;Gait training;Stair training;Functional mobility training;Therapeutic activities;Therapeutic exercise;Balance training;Patient/family education    PT Goals (Current goals can be found in the Care Plan section)  Acute Rehab PT Goals Patient Stated Goal: to be able to straighten his knee PT Goal Formulation: With patient Time For Goal Achievement: 09/01/17 Potential to Achieve Goals: Good    Frequency Min 3X/week   Barriers to discharge        Co-evaluation               AM-PAC PT "6 Clicks" Daily Activity  Outcome Measure Difficulty turning over in bed (including adjusting bedclothes, sheets and blankets)?: None Difficulty moving from lying on back to sitting on the side of the bed? : None Difficulty sitting down on and standing up from a chair with arms (e.g., wheelchair, bedside commode, etc,.)?: A Lot Help needed moving to and from a bed to chair (including a wheelchair)?: A Lot Help needed walking in hospital room?: A Lot Help needed climbing 3-5 steps with a railing? : A Lot 6 Click Score: 16    End of Session Equipment Utilized During Treatment: Gait belt Activity Tolerance: Patient limited by pain Patient left: in chair;with call bell/phone within reach Nurse Communication: Mobility status PT Visit Diagnosis: Unsteadiness on feet (R26.81);Other abnormalities of gait and mobility (R26.89);Pain Pain - Right/Left: Right Pain - part of body: Knee    Time: 7366-8159 PT Time Calculation (min) (ACUTE ONLY): 13 min   Charges:   PT Evaluation $PT Eval Moderate Complexity: 1 Mod     PT G Codes:        Colin Broach PT, DPT     Ajeenah Heiny Aletha Halim 08/25/2017, 1:16 PM

## 2017-08-25 NOTE — Plan of Care (Signed)
  Problem: Activity: Goal: Risk for activity intolerance will decrease Outcome: Progressing   Problem: Nutrition: Goal: Adequate nutrition will be maintained Outcome: Progressing   Problem: Elimination: Goal: Will not experience complications related to bowel motility Outcome: Progressing   

## 2017-08-26 DIAGNOSIS — F17210 Nicotine dependence, cigarettes, uncomplicated: Secondary | ICD-10-CM

## 2017-08-26 DIAGNOSIS — I5032 Chronic diastolic (congestive) heart failure: Secondary | ICD-10-CM

## 2017-08-26 DIAGNOSIS — F1490 Cocaine use, unspecified, uncomplicated: Secondary | ICD-10-CM

## 2017-08-26 DIAGNOSIS — M00861 Arthritis due to other bacteria, right knee: Secondary | ICD-10-CM

## 2017-08-26 LAB — CBC
HCT: 30.6 % — ABNORMAL LOW (ref 39.0–52.0)
HEMOGLOBIN: 9.4 g/dL — AB (ref 13.0–17.0)
MCH: 26.9 pg (ref 26.0–34.0)
MCHC: 30.7 g/dL (ref 30.0–36.0)
MCV: 87.7 fL (ref 78.0–100.0)
PLATELETS: 323 10*3/uL (ref 150–400)
RBC: 3.49 MIL/uL — AB (ref 4.22–5.81)
RDW: 15.2 % (ref 11.5–15.5)
WBC: 9.9 10*3/uL (ref 4.0–10.5)

## 2017-08-26 LAB — BASIC METABOLIC PANEL
Anion gap: 10 (ref 5–15)
BUN: 11 mg/dL (ref 6–20)
CO2: 28 mmol/L (ref 22–32)
Calcium: 8.7 mg/dL — ABNORMAL LOW (ref 8.9–10.3)
Chloride: 103 mmol/L (ref 101–111)
Creatinine, Ser: 0.59 mg/dL — ABNORMAL LOW (ref 0.61–1.24)
GFR calc Af Amer: >60 mL/min (ref >60)
Glucose, Bld: 108 mg/dL — ABNORMAL HIGH (ref 65–99)
POTASSIUM: 3.8 mmol/L (ref 3.5–5.1)
SODIUM: 141 mmol/L (ref 135–145)

## 2017-08-26 LAB — GLUCOSE, CAPILLARY
GLUCOSE-CAPILLARY: 114 mg/dL — AB (ref 65–99)
Glucose-Capillary: 161 mg/dL — ABNORMAL HIGH (ref 65–99)
Glucose-Capillary: 146 mg/dL — ABNORMAL HIGH (ref 65–99)
Glucose-Capillary: 135 mg/dL — ABNORMAL HIGH (ref 65–99)

## 2017-08-26 LAB — BODY FLUID CULTURE

## 2017-08-26 LAB — C-REACTIVE PROTEIN: CRP: 17.2 mg/dL — ABNORMAL HIGH (ref <1.0)

## 2017-08-26 MED ORDER — OXYCODONE HCL 5 MG PO TABS
5.0000 mg | ORAL_TABLET | Freq: Once | ORAL | Status: AC
  Administered 2017-08-26: 5 mg via ORAL
  Filled 2017-08-26: qty 1

## 2017-08-26 MED ORDER — CIPROFLOXACIN HCL 500 MG PO TABS
500.0000 mg | ORAL_TABLET | Freq: Two times a day (BID) | ORAL | Status: DC
  Administered 2017-08-26 – 2017-08-29 (×7): 500 mg via ORAL
  Filled 2017-08-26 (×7): qty 1

## 2017-08-26 MED ORDER — SODIUM CHLORIDE 0.9 % IV SOLN
2.0000 g | INTRAVENOUS | Status: DC
  Administered 2017-08-26: 2 g via INTRAVENOUS
  Filled 2017-08-26: qty 20

## 2017-08-26 NOTE — Consult Note (Signed)
Date of Admission:  08/23/2017          Reason for Consult: Septic knee with Serratia   Referring Provider: Dr. Pollie Meyer   Assessment: 1.  Septic knee with Serratia status post arthroscopic debridement 2.  Likely continued IV drug use 3.  Chronic diastolic heart failure  Plan: 1. Changed to ciprofloxacin 500 mg twice daily and complete 6 weeks of therapy 2. Follow-up closely with orthopedic surgery also arrange follow-up with [place] 3. Ensure that he has been checked for viral hepatitides  Active Problems:   Hypertension   Chronic diastolic heart failure (HCC)   Septic joint (HCC)   Acute pain of right knee   Polysubstance abuse (HCC)   Scheduled Meds: . acetaminophen  650 mg Oral TID  . aspirin EC  81 mg Oral Daily  . carvedilol  25 mg Oral BID WC  . ciprofloxacin  500 mg Oral BID  . insulin aspart  0-9 Units Subcutaneous TID WC  . lisinopril  10 mg Oral Daily  . nicotine  21 mg Transdermal Daily  . sodium chloride flush  3 mL Intravenous Q12H  . spironolactone  25 mg Oral Daily   Continuous Infusions: . sodium chloride    . lactated ringers     PRN Meds:.sodium chloride, polyethylene glycol, sodium chloride flush, traMADol  HPI: Larry Mason is a 42 y.o. male history of IV drug use who claims he has been clean for several years but recently selling cocaine.  Note his screen was positive for cocaine but not opiates during this admission.  He came to the hospital with severe right knee pain.  He was found to be septic.  He underwent arthroscopic debridement by Dr.Varkey and Serratia has been isolated.  Was narrowed to ceftriaxone yesterday.  I suspect he is still using intravenous drugs and certainly the risk of temptation of him succumbing to this again if he were to go home with a PICC line is unacceptable.  Furthermore we have at our disposal a very bioavailable antibiotic that we can use for him that will be essentially like giving him an IV antibiotic without  giving him an IV antibiotic that being ciprofloxacin.  I am changing him to ciprofloxacin 500 mg every 12 hours.  I would like him to complete a 42-day course of this.  It would be nice if he could be supply the medication since he does not have insurance.  I have asked case management to look into this.  I will arrange for hospital follow-up in our clinic in the next month I have also added a hepatitis C and hepatitis B to his blood work that I would have drawn today.  Thank of his consult and I will sign off for now please call with further questions.   Review of Systems: Review of Systems  Constitutional: Positive for diaphoresis, fever and malaise/fatigue. Negative for chills and weight loss.  HENT: Negative for congestion, hearing loss, sore throat and tinnitus.   Eyes: Negative for blurred vision and double vision.  Respiratory: Negative for cough, sputum production, shortness of breath and wheezing.   Cardiovascular: Negative for chest pain, palpitations and leg swelling.  Gastrointestinal: Positive for nausea. Negative for abdominal pain, blood in stool, constipation, diarrhea, heartburn, melena and vomiting.  Genitourinary: Negative for dysuria, flank pain and hematuria.  Musculoskeletal: Positive for myalgias. Negative for back pain, falls and joint pain.  Skin: Negative for itching and rash.  Neurological: Negative for dizziness, sensory  change, focal weakness, loss of consciousness, weakness and headaches.  Endo/Heme/Allergies: Does not bruise/bleed easily.  Psychiatric/Behavioral: Negative for depression, memory loss and suicidal ideas. The patient is not nervous/anxious.     Past Medical History:  Diagnosis Date  . Chronic kidney disease 1984  . Cocaine abuse (HCC)   . Diabetes mellitus without complication (HCC)   . Heroin abuse (HCC)   . IVDU (intravenous drug user)   . Morbid obesity (HCC)   . Myocardial infarction [facility])     Social History   Tobacco Use  .  Smoking status: Current Every Day Smoker    Packs/day: 0.50    Types: Cigarettes  Substance Use Topics  . Alcohol use: No  . Drug use: Yes    Comment: heroin last 6 weeks ago    Family History  Problem Relation Age of Onset  . Diabetes Mellitus II Mother   . Hypertension Father    No Known Allergies  OBJECTIVE: Blood pressure (!) 160/80, pulse 93, temperature 98.6 F (37 C), resp. rate 18, height 5\' 7"  (1.702 m), weight 290 lb (131.5 kg), SpO2 95 %.  Physical Exam  Constitutional: He is oriented to person, place, and time. He appears well-developed and well-nourished. He is cooperative. He does not appear ill. No distress.  HENT:  Head: Normocephalic and atraumatic.  Right Ear: Hearing and external ear normal.  Left Ear: Hearing and external ear normal.  Nose: No rhinorrhea or nasal deformity. No epistaxis.  Eyes: Pupils are equal, round, and reactive to light. Conjunctivae and EOM are normal. Right conjunctiva is not injected. Left conjunctiva is not injected. No scleral icterus.  Neck: Normal range of motion. Neck supple. No JVD present.  Cardiovascular: Normal rate, regular rhythm, S1 normal, S2 normal and normal heart sounds. Exam reveals no friction rub.  No murmur heard. Pulmonary/Chest: Effort normal. No respiratory distress. He has no wheezes.  Abdominal: Soft. Normal appearance and bowel sounds are normal. He exhibits no distension and no ascites. There is no hepatosplenomegaly. There is no tenderness.  Musculoskeletal: Normal range of motion.       Right shoulder: Normal.       Left shoulder: Normal.       Right hip: Normal.       Left hip: Normal.       Right knee: Normal.       Left knee: Normal.  Lymphadenopathy:       Head (right side): No submandibular, no preauricular and no posterior auricular adenopathy present.       Head (left side): No submandibular, no preauricular and no posterior auricular adenopathy present.    He has no cervical adenopathy.        Right cervical: No superficial cervical and no deep cervical adenopathy present.      Left cervical: No superficial cervical and no deep cervical adenopathy present.  Neurological: He is alert and oriented to person, place, and time. He has normal strength. No sensory deficit. Coordination and gait normal.  Skin: Skin is warm, dry and intact. No abrasion, no bruising, no ecchymosis, no lesion and no rash noted. He is not diaphoretic. No cyanosis or erythema. No pallor. Nails show no clubbing.  Psychiatric: He has a normal mood and affect. His speech is normal and behavior is normal. Judgment and thought content normal. Cognition and memory are normal. He is attentive.  Nursing note and vitals reviewed.  Knee site bandaged  Lab Results Lab Results  Component Value Date  WBC 9.9 08/26/2017   HGB 9.4 (L) 08/26/2017   HCT 30.6 (L) 08/26/2017   MCV 87.7 08/26/2017   PLT 323 08/26/2017    Lab Results  Component Value Date   CREATININE 0.59 (L) 08/26/2017   BUN 11 08/26/2017   NA 141 08/26/2017   K 3.8 08/26/2017   CL 103 08/26/2017   CO2 28 08/26/2017    Lab Results  Component Value Date   ALT 15 (L) 07/25/2015   AST 19 07/25/2015   ALKPHOS 57 07/25/2015   BILITOT 0.7 07/25/2015     Microbiology: Recent Results (from the past 240 hour(s))  Body fluid culture     Status: None   Collection Time: 08/23/17  2:49 AM  Result Value Ref Range Status   Specimen Description   Final    KNEE RIGHT Performed at [facility], [address] [place], [place] [address]    Special Requests   Final    NONE Performed at [facility], [address] [place], [place] [address]    Gram Stain   Final    ABUNDANT WBC PRESENT, PREDOMINANTLY PMN NO ORGANISMS SEEN Performed at [facility], [address] [place], [place] [address]    Culture RARE SERRATIA MARCESCENS  Final   Report Status 08/26/2017 FINAL  Final   Organism ID, Bacteria SERRATIA MARCESCENS  Final       Susceptibility   Serratia marcescens - MIC*    CEFAZOLIN >=64 RESISTANT Resistant     CEFEPIME <=1 SENSITIVE Sensitive     CEFTAZIDIME <=1 SENSITIVE Sensitive     CEFTRIAXONE <=1 SENSITIVE Sensitive     CIPROFLOXACIN <=0.25 SENSITIVE Sensitive     GENTAMICIN <=1 SENSITIVE Sensitive     TRIMETH/SULFA <=20 SENSITIVE Sensitive     * RARE SERRATIA MARCESCENS  MRSA PCR Screening     Status: None   Collection Time: 08/24/17  2:36 AM  Result Value Ref Range Status   MRSA by PCR NEGATIVE NEGATIVE Final    Comment:        The GeneXpert MRSA Assay (FDA approved for NASAL specimens only), is one component of a comprehensive MRSA colonization surveillance program. It is not intended to diagnose MRSA infection nor to guide or monitor treatment for MRSA infections. Performed at [facility], [address], [place], [place] [address]   Aerobic/Anaerobic Culture (surgical/deep wound)     Status: None (Preliminary result)   Collection Time: 08/24/17  8:23 AM  Result Value Ref Range Status   Specimen Description FLUID RIGHT KNEE  Final   Special Requests 3G ANCEF  Final   Gram Stain   Final    ABUNDANT WBC PRESENT, PREDOMINANTLY PMN NO ORGANISMS SEEN    Culture   Final    RARE SERRATIA MARCESCENS SUSCEPTIBILITIES PERFORMED ON PREVIOUS CULTURE WITHIN THE LAST 5 DAYS. Performed at [facility], [address] [place], [place] [address]    Report Status PENDING  Incomplete  Aerobic/Anaerobic Culture (surgical/deep wound)     Status: None (Preliminary result)   Collection Time: 08/24/17  8:27 AM  Result Value Ref Range Status   Specimen Description FLUID RIGHT KNEE  Final   Special Requests 3G ANCEF  Final   Gram Stain   Final    ABUNDANT WBC PRESENT, PREDOMINANTLY PMN NO ORGANISMS SEEN    Culture RARE SERRATIA MARCESCENS  Final   Report Status PENDING  Incomplete   Organism ID, Bacteria  SERRATIA MARCESCENS  Final      Susceptibility   Serratia marcescens - MIC*     CEFAZOLIN >=64 RESISTANT Resistant     CEFEPIME <=1 SENSITIVE Sensitive     CEFTAZIDIME <=1 SENSITIVE Sensitive     CEFTRIAXONE <=1 SENSITIVE Sensitive     CIPROFLOXACIN <=0.25 SENSITIVE Sensitive     GENTAMICIN <=1 SENSITIVE Sensitive     TRIMETH/SULFA Value in next row Sensitive      <=20 SENSITIVEPerformed at [facility], [address], [place], [place] [address]    * RARE SERRATIA MARCESCENS    Acey Lav, MD [facility] [phone] pager  08/26/2017, 3:40 PM

## 2017-08-26 NOTE — Progress Notes (Signed)
Physical Therapy Treatment Patient Details Name: Larry Mason MRN: [id number] DOB: 10/30/1975 Today's Date: 08/26/2017    History of Present Illness Pt is a 42 yo male admitted through ED with left knee pain. Pt was found to have an infection in his right knee & underwent an I&D yesterday 08/24/17. PMH significant for CHF, polysubstance abuse (cocaine, heroin), Dm2, obesity, MI.     PT Comments    Pt performed gait training with axillary crutches and poor safety.  Plan for use of RW next session to improve safety.  Pt remains unable to straighten R knee which hinders symmetry and quality of gait training.  LOB noted x1 when turning toward bed.  Pt with intense pain and instructed in R HC/HS stretch 3x15 sec to encourage knee extension.  Plan next session for gait training with RW and emphasis on knee extension.    Follow Up Recommendations  Other (comment)(HHPT or OP PT. )     Equipment Recommendations  None recommended by PT    Recommendations for Other Services       Precautions / Restrictions Precautions Precautions: Fall Restrictions Weight Bearing Restrictions: No    Mobility  Bed Mobility Overal bed mobility: Modified Independent             General bed mobility comments: able to get EOB without assistance, increased time and effort noted due to increased pain.    Transfers Overall transfer level: Needs assistance Equipment used: Crutches Transfers: Sit to/from Stand Sit to Stand: Min guard         General transfer comment: Min guard assistance with poor placement of hand to use crutches.    Ambulation/Gait Ambulation/Gait assistance: Min guard;Min assist Ambulation Distance (Feet): 70 Feet Assistive device: Crutches Gait Pattern/deviations: Step-to pattern;Antalgic;Decreased step length - left;Decreased stance time - right;Decreased dorsiflexion - right Gait velocity: decreased   General Gait Details: Cues for R heel strike and knee extension in  stance phase.  Pt's crutches from home are too high and he uses them incorrectly resting armpits on top support with widebase.  Pt would benefit from RW for improved safety and better carryover with gait sequencing.     Stairs             Wheelchair Mobility    Modified Rankin (Stroke Patients Only)       Balance Overall balance assessment: Needs assistance   Sitting balance-Leahy Scale: Good       Standing balance-Leahy Scale: Poor                              Cognition Arousal/Alertness: Awake/alert Behavior During Therapy: Anxious;Restless Overall Cognitive Status: Within Functional Limits for tasks assessed                                        Exercises Total Joint Exercises Quad Sets: AAROM;Right;10 reps;Supine Heel Slides: Right;5 reps;AAROM;Supine Hip ABduction/ADduction: Right;5 reps;Supine;AAROM Straight Leg Raises: AAROM;Right;5 reps;Supine Knee Flexion: AROM;Right;5 reps;Seated Goniometric ROM: grossly 20-85 degrees.      General Comments        Pertinent Vitals/Pain Pain Assessment: 0-10 Pain Score: 10-Worst pain ever Pain Location: right knee Pain Descriptors / Indicators: Aching Pain Intervention(s): Monitored during session;Repositioned;Ice applied(increased pain noted with extension)    Home Living  Prior Function            PT Goals (current goals can now be found in the care plan section) Acute Rehab PT Goals Patient Stated Goal: to be able to straighten his knee Potential to Achieve Goals: Good Progress towards PT goals: Progressing toward goals    Frequency    Min 3X/week      PT Plan Current plan remains appropriate    Co-evaluation              AM-PAC PT "6 Clicks" Daily Activity  Outcome Measure  Difficulty turning over in bed (including adjusting bedclothes, sheets and blankets)?: None Difficulty moving from lying on back to sitting on the side of  the bed? : None Difficulty sitting down on and standing up from a chair with arms (e.g., wheelchair, bedside commode, etc,.)?: A Little Help needed moving to and from a bed to chair (including a wheelchair)?: A Little Help needed walking in hospital room?: A Lot Help needed climbing 3-5 steps with a railing? : A Lot 6 Click Score: 18    End of Session Equipment Utilized During Treatment: Gait belt Activity Tolerance: Patient limited by pain Patient left: in chair;with call bell/phone within reach Nurse Communication: Mobility status PT Visit Diagnosis: Unsteadiness on feet (R26.81);Other abnormalities of gait and mobility (R26.89);Pain Pain - Right/Left: Right Pain - part of body: Knee     Time: 1610-9604 PT Time Calculation (min) (ACUTE ONLY): 24 min  Charges:  $Gait Training: 8-22 mins $Therapeutic Exercise: 8-22 mins                    G Codes:       Joycelyn Rua, PTA pager [phone]    Florestine Avers 08/26/2017, 1:59 PM

## 2017-08-26 NOTE — Progress Notes (Signed)
Pharmacy Antibiotic Note  Larry Mason is a 42 y.o. male admitted on 08/23/2017 with septic knee, now s/p I&D arthroscopy on 08/22/17.  Pharmacy has been consulted for Zosyn dosing.  Renal function is stable, afebrile, WBC normalized.   Plan: Continue Zosyn EID 3.375gm IV Q8H.  Pharmacy will sign off as dosage adjustment is likely unnecessary.  Thank you for the consult!  Narrowing per MD once sensitivity available.   Height: 5\' 7"  (170.2 cm) Weight: 290 lb (131.5 kg) IBW/kg (Calculated) : 66.1  Temp (24hrs), Avg:98.9 F (37.2 C), Min:98.5 F (36.9 C), Max:99.2 F (37.3 C)  Recent Labs  Lab 08/23/17 1428 08/24/17 0554 08/25/17 0732 08/25/17 1923 08/26/17 0636  WBC 15.3* 13.6* 12.8* 10.7* 9.9  CREATININE 0.61 0.64 0.73  --  0.59*    Estimated Creatinine Clearance: 155.4 mL/min (A) (by C-G formula based on SCr of 0.59 mg/dL (L)).    No Known Allergies  Vanc 4/12>>4/14 Zosyn 4/12>>  4/13 fluid right knee - Serratia 4/13 MRSA PCR - negative   Aarit Kashuba D. Laney Potash, PharmD, BCPS Pager:  [phone] 08/26/2017, 9:15 AM

## 2017-08-26 NOTE — Discharge Summary (Addendum)
Family Medicine Teaching [facility] Discharge Summary  Patient name: Larry Mason Medical record number: [id number] Date of birth: [birth year] Age: 42 y.o. Gender: male Date of Admission: 08/23/2017  Date of Discharge: 08/29/2017 Admitting Physician: Howard Pouch, MD  Primary Care Provider: Patient, No Pcp Per Consultants: Orthopedics, ID  Indication for Hospitalization: R septic knee  Discharge Diagnoses/Problem List:  Septic arthritis of R knee, s/p arthocentesis HFrEF, stable Diabetes, stable H/o polysubstance use  Disposition: Home  Discharge Condition: Improved  Discharge Exam:  General: lying in bed, in mild distress Cardiovascular: RRR, no mrg Respiratory: CTAB, no wheezes/rales/rhonchi Abdomen: obese abdomen, +BS, soft, nontender Extremities: RLE wrapped, steri strips in place. No erythema. Feet warm, well perfused, able to move extremities spontaneously.  Brief Hospital Course:  Larry Mason is a 42 y.o. male with PMH significant for T2DM, HFrEF (20-25% EF), polysubstance use disorder who presented with right knee pain found to have a septic right knee. Patient underwent arthrocentesis on 4/13 with cultures growing Serratia marcescens, sensitive to Ceftriaxone and Cipro. Patient was empirically treated with Vancomycin, Zosyn, and then Ceftriaxone prior to transitioning to PO Cipro based on wound culture sensitivities which he tolerated well. 2 days after initial arthroscopy patient required additional washout due to swelling and pain on 4/16 with no further growth on culture noted. ID was consulted who recommended 6 week course of Cipro and hepatitis screen due to patient's history of polysubstance use (Hep B/C negative). ID did not feel endocarditis workup was warranted with TEE and therefore was not pursued. Patient required few doses of Dilaudid for pain control but was mostly managed with PRN tramadol which he will be supplied 3 day supply on discharge. Patient  was counseled extensively on the use of cocaine given his history of heart failure and Coreg use which he expressed a desire for cessation of cocaine. His Coreg was continued. His Type 2 Diabetes was well controlled with minimal use of sliding scale this admission. He will follow up with ID and Orthopedics outpatient.  Issues for Follow Up:  1. Medication Changes: 1. Start Ciprofloxacin 500mg  BID for 6 weeks (last day 5/26) 2. Start Aspirin 81mg  for 6 weeks (last day (5/26) 3. Discontinued glimepiride. Start Metformin 500mg  daily. 4. Increased Lisinopril to 20mg  5. Patient was given 3 day supply of PRN Tramadol. Given patient's polysubstance use history, recommend judicious use of pain control. 6. Carvedilol was continued despite patient's history of cocaine use due to heart failure. Patient was counseled on dangers of using cocaine with beta blocker and he was adamant about stopping cocaine. 2. Patient will follow up with Orthopedics in 2 weeks for skin incision check. 3. Patient will likely need follow up with Infectious Disease. 4. Patient is referred to outpatient PT.  Significant Procedures: Arthoscopy and washout of R knee 4/13 and 4/16.  Significant Labs and Imaging:  Recent Labs  Lab 08/27/17 0428 08/28/17 0759 08/29/17 0555  WBC 12.1* 11.1* 11.7*  HGB 8.7* 9.2* 9.4*  HCT 28.2* 29.4* 30.8*  PLT 424* 455* 508*   Recent Labs  Lab 08/25/17 0732 08/26/17 0636 08/27/17 0428 08/28/17 0759 08/29/17 0555  NA 137 141 140 137 139  K 3.8 3.8 4.6 3.3* 3.9  CL 99* 103 103 101 104  CO2 28 28 26 24 25   GLUCOSE 157* 108* 118* 204* 135*  BUN 10 11 9 7 11   CREATININE 0.73 0.59* 0.62 0.67 0.65  CALCIUM 8.3* 8.7* 8.7* 8.8* 8.8*   Dg Knee Complete 4 Views  Right  Result Date: 08/23/2017 CLINICAL DATA:  RIGHT knee pain and swelling for 2 days, no known injury EXAM: RIGHT KNEE - COMPLETE 4+ VIEW COMPARISON:  MR RIGHT knee 03/22/2007 FINDINGS: Low normal osseous mineralization.  Tricompartmental joint space narrowing and spur formation. Bone-on-bone appearance at medial compartment. No acute fracture, dislocation, or bone destruction. Probable calcified loose body posteriorly. Large joint effusion. Remaining soft tissues unremarkable. 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 No definite acute bony abnormalities. Electronically Signed   By: Ulyses Southward M.D.   On: 08/23/2017 15:35    Results/Tests Pending at Time of Discharge: None  Discharge Medications:  Allergies as of 08/29/2017   No Known Allergies     Medication List    STOP taking these medications   glimepiride 1 MG tablet Commonly known as:  AMARYL     TAKE these medications   aspirin 81 MG EC tablet Take 1 tablet (81 mg total) by mouth daily.   carvedilol 25 MG tablet Commonly known as:  COREG Take 25 mg by mouth 2 (two) times daily with a meal.   ciprofloxacin 500 MG tablet Commonly known as:  CIPRO Take 1 tablet (500 mg total) by mouth 2 (two) times daily.   ibuprofen 200 MG tablet Commonly known as:  ADVIL,MOTRIN Take 800 mg by mouth every 6 (six) hours as needed for moderate pain (pain and swelling).   lisinopril 20 MG tablet Commonly known as:  PRINIVIL,ZESTRIL Take 1 tablet (20 mg total) by mouth daily. What changed:    medication strength  how much to take   metFORMIN 500 MG tablet Commonly known as:  GLUCOPHAGE Take 1 tablet (500 mg total) by mouth daily with breakfast.   spironolactone 25 MG tablet Commonly known as:  ALDACTONE Take 1 tablet (25 mg total) by mouth daily.   traMADol 50 MG tablet Commonly known as:  ULTRAM Take 1 tablet (50 mg total) by mouth every 6 (six) hours as needed for up to 3 days.       Discharge Instructions: Please refer to Patient Instructions section of EMR for full details.  Patient was counseled important  signs and symptoms that should prompt return to medical care, changes in medications, dietary instructions, activity restrictions, and follow up appointments.   Follow-Up Appointments: Follow-up Information    [facility]-Home Follow up.   Specialty:  Home Health Services Why:  A representative from [facility] will contact you to arrange start date and time for your therapy.  Contact information: [address] [place] [address] [phone]        [facility]. Go to.   Why:  Your appointment is on Wednesday, Sep 25, 2017 at 2:10pm  with Mrs. Fleming,NP Contact information: [address] [place] [address] [phone]          Ellwood Dense, DO 08/30/2017, 12:14 PM PGY-1, [facility]

## 2017-08-26 NOTE — Progress Notes (Addendum)
Family Medicine Teaching Service Daily Progress Note Intern Pager: [phone]  Patient name: Larry Mason Medical record number: [id number] Date of birth: Jul 13, 1975 Age: 42 y.o. Gender: male  Primary Care Provider: Patient, No Pcp Per Consultants: orthopedics Code Status: full  Pt Overview and Major Events to Date:  4/12 - admit FPTS 4/13 - I&D/arthoscopy by ortho  Assessment and Plan: Larry Mason is a 42 y.o. male presenting with right knee pain found to have a septic right knee. PMH is significant for T2DM, HFrEF (20-25% EF), polysubstance abuse  Septic arthritis, right knee - S/p arthroscopy and washout 4/13. Rare Serratia marcescens, sensitive to Cipro, CTX. Patient remains afebrile. R knee wrapped with drain in place. Leukocytosis resolved 15.3 >> 9.9. Op note from 4/13 showed gross purulence, tricompartmental grade 4 cartilage loss, meniscal insufficiency of medial compartment and incompetent ACL with flexion contracture. Hgb improved postop 9.5. Received one dose of PRN tramadol overnight. - follow up ortho recs, appreciate care --> WBAT, will need wound recheck in 7 days, and continue drain for ~24 hrs - Discussed patient with Dr. Luciana Axe of ID yesterday. He informed that Serratia is like Staph, and does not feel like patient needs endocarditis workup with TEE.  - s/p Vancomycin (4/12-4/14), Zosyn (4/12-4/15) - start CTX IV (4/15 - ) - consult ID for guidance on when to switch to PO and duration of course.  - Will schedule tylenol 650 TID, continue tramadol PRN pain - Try to limit IV pain medication - monitor fever curve - vitals per unit - PT - recommends HHPT or outpt f/u  HFrEF - Cardiac echo 07/2014 notable for EF 20-25%, no wall motion abnormalities. Euvolemic. Reports he follows with Dr. Sharyn Lull and last saw him about 2 months ago. Home meds include lisinopril 10 mg daily, spiro 25 mg daily, coreg 25 mg BID.  - continue home meds - monitor BP  Diabetes - home  meds include glimepiride 1 mg daily. Last A1c documented in epic is 6.8 from 07/2014. Patient does not recall if there has been one more recently. CBG this am 114. 7u total SSI received in 24 hours. - sensitive sliding scale insulin with QAC/QHS CBGs   Polysubstance abuse - cocaine and heroin noted on problem list. Concern with hx cocaine abuse and B-blocker but attending discussed with patient 4/13. Patient adamant he would not use drugs while taking coreg. UDS +cocaine. - Limit IV pain medication as able  FEN/GI: Heart Healthy, carb modified Prophylaxis: asa per surgery, SCDs  Disposition: pending transition to PO antibiotics  Subjective:  Patient feeling well this morning, in a little pain but feels it is well controlled overall. Again expresses his cessation from cocaine.  Objective: Temp:  [98.5 F (36.9 C)-99.2 F (37.3 C)] 99.2 F (37.3 C) (04/15 0646) Pulse Rate:  [71-84] 71 (04/15 0646) Resp:  [14] 14 (04/14 2148) BP: (121-147)/(55-89) 147/89 (04/15 0646) SpO2:  [94 %-96 %] 94 % (04/15 0646) Physical Exam: General: lying in bed, in NAD, pleasant Cardiovascular: RRR, no murmurs/rubs/gallops Respiratory: CTAB, no wheezes/rales/rhonchi Abdomen: obese abdomen, +BS, soft, nontender Extremities: Right LE in compression wrap with wound drain in place, minimal bloody output. Toes warm. Can wiggle toes.  Laboratory: Recent Labs  Lab 08/25/17 0732 08/25/17 1923 08/26/17 0636  WBC 12.8* 10.7* 9.9  HGB 8.6* 9.5* 9.4*  HCT 29.0* 31.7* 30.6*  PLT 318 345 323   Recent Labs  Lab 08/24/17 0554 08/25/17 0732 08/26/17 0636  NA 136 137 141  K 4.3  3.8 3.8  CL 101 99* 103  CO2 26 28 28   BUN 12 10 11   CREATININE 0.64 0.73 0.59*  CALCIUM 8.7* 8.3* 8.7*  GLUCOSE 108* 157* 108*   UDS +cocaine CRP 19  R knee arthocentesis resulted in 143k WBCs, 89 neutorphils, turbid, no organisms. Surgical culture growing rare Serratia.  Imaging/Diagnostic Tests: Dg Knee Complete 4 Views  Right  Result Date: 08/23/2017 CLINICAL DATA:  RIGHT knee pain and swelling for 2 days, no known injury EXAM: RIGHT KNEE - COMPLETE 4+ VIEW COMPARISON:  MR RIGHT knee 03/22/2007 FINDINGS: Low normal osseous mineralization. Tricompartmental joint space narrowing and spur formation. Bone-on-bone appearance at medial compartment. No acute fracture, dislocation, or bone destruction. Probable calcified loose body posteriorly. Large joint effusion. Remaining soft tissues unremarkable. 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 No definite acute bony abnormalities. Electronically Signed   By: Ulyses Southward M.D.   On: 08/23/2017 15:35   Ellwood Dense, DO 08/26/2017, 9:32 AM PGY-1, [facility] FPTS Intern pager: [phone], text pages welcome

## 2017-08-26 NOTE — Progress Notes (Signed)
ORTHOPAEDIC PROGRESS NOTE  s/p Procedure(s): ARTHROSCOPY KNEE IRRIGATION AND DEBRIDEMENT  SUBJECTIVE: Minimal complaints  OBJECTIVE: PE:RLE: drain in place but was removed today.  Some purulent material in drain, distal motor and sensory preserved, dressing CDI  Vitals:   08/26/17 1342 08/26/17 1345  BP:  (!) 160/80  Pulse: 95 93  Resp: 18 18  Temp:  98.6 F (37 C)  SpO2: 96% 95%     ASSESSMENT: Larry Mason is a 42 y.o. male doing well postoperatively.  Patient with some purulent material in drain today.  Removed and if patient not improving tomorrow may require repeat washout Wednesday.    PLAN: Weightbearing: WBAT RLE Insicional and dressing care: drain out, will reexamine in AM Orthopedic device(s): None Showering: after wound reassessed tomorrow VTE prophylaxis: Aspirin 81mg  qd 6 weeks Pain control: prn meds, minimize narcs Follow - up plan: 2 weeks for wound check Contact information:  Weekdays 8-5 Ramond Marrow MD [phone], After hours and holidays please check [email or web address] for group call information for [facility]

## 2017-08-27 ENCOUNTER — Inpatient Hospital Stay (HOSPITAL_COMMUNITY): Payer: Self-pay | Admitting: Anesthesiology

## 2017-08-27 ENCOUNTER — Encounter (HOSPITAL_COMMUNITY): Payer: Self-pay

## 2017-08-27 ENCOUNTER — Encounter (HOSPITAL_COMMUNITY)
Admission: EM | Disposition: A | Discharge status: Home Health | Payer: Self-pay | Source: Home / Self Care | Attending: Family Medicine

## 2017-08-27 HISTORY — PX: KNEE ARTHROSCOPY: SHX127

## 2017-08-27 LAB — CBC
HEMATOCRIT: 28.2 % — AB (ref 39.0–52.0)
Hemoglobin: 8.7 g/dL — ABNORMAL LOW (ref 13.0–17.0)
MCH: 26.6 pg (ref 26.0–34.0)
MCHC: 30.9 g/dL (ref 30.0–36.0)
MCV: 86.2 fL (ref 78.0–100.0)
Platelets: 424 10*3/uL — ABNORMAL HIGH (ref 150–400)
RBC: 3.27 MIL/uL — ABNORMAL LOW (ref 4.22–5.81)
RDW: 14.8 % (ref 11.5–15.5)
WBC: 12.1 10*3/uL — ABNORMAL HIGH (ref 4.0–10.5)

## 2017-08-27 LAB — HEPATITIS C ANTIBODY (REFLEX)

## 2017-08-27 LAB — BASIC METABOLIC PANEL
Anion gap: 11 (ref 5–15)
BUN: 9 mg/dL (ref 6–20)
CALCIUM: 8.7 mg/dL — AB (ref 8.9–10.3)
CO2: 26 mmol/L (ref 22–32)
CREATININE: 0.62 mg/dL (ref 0.61–1.24)
Chloride: 103 mmol/L (ref 101–111)
GFR calc non Af Amer: >60 mL/min (ref >60)
GLUCOSE: 118 mg/dL — AB (ref 65–99)
Potassium: 4.6 mmol/L (ref 3.5–5.1)
Sodium: 140 mmol/L (ref 135–145)

## 2017-08-27 LAB — GLUCOSE, CAPILLARY
Glucose-Capillary: 146 mg/dL — ABNORMAL HIGH (ref 65–99)
Glucose-Capillary: 140 mg/dL — ABNORMAL HIGH (ref 65–99)
Glucose-Capillary: 169 mg/dL — ABNORMAL HIGH (ref 65–99)
Glucose-Capillary: 143 mg/dL — ABNORMAL HIGH (ref 65–99)

## 2017-08-27 LAB — HCV COMMENT:

## 2017-08-27 LAB — SEDIMENTATION RATE: Sed Rate: 131 mm/hr — ABNORMAL HIGH (ref 0–16)

## 2017-08-27 LAB — HEPATITIS B SURFACE ANTIGEN: HEP B S AG: NEGATIVE

## 2017-08-27 SURGERY — ARTHROSCOPY, KNEE
Anesthesia: General | Site: Knee | Laterality: Right

## 2017-08-27 MED ORDER — HYDROMORPHONE HCL 2 MG/ML IJ SOLN
INTRAMUSCULAR | Status: AC
  Filled 2017-08-27: qty 1

## 2017-08-27 MED ORDER — PROPOFOL 10 MG/ML IV BOLUS
INTRAVENOUS | Status: DC | PRN
  Administered 2017-08-27: 200 mg via INTRAVENOUS

## 2017-08-27 MED ORDER — MIDAZOLAM HCL 2 MG/2ML IJ SOLN
INTRAMUSCULAR | Status: AC
  Filled 2017-08-27: qty 2

## 2017-08-27 MED ORDER — SODIUM CHLORIDE 0.9 % IR SOLN
Status: DC | PRN
  Administered 2017-08-27: 1000 mL
  Administered 2017-08-27: 12000 mL

## 2017-08-27 MED ORDER — LISINOPRIL 20 MG PO TABS
20.0000 mg | ORAL_TABLET | Freq: Every day | ORAL | Status: DC
Start: 2017-08-27 — End: 2017-08-29
  Administered 2017-08-27 – 2017-08-29 (×3): 20 mg via ORAL
  Filled 2017-08-27 (×3): qty 1

## 2017-08-27 MED ORDER — ACETAMINOPHEN 10 MG/ML IV SOLN
INTRAVENOUS | Status: DC | PRN
  Administered 2017-08-27: 1000 mg via INTRAVENOUS

## 2017-08-27 MED ORDER — LIDOCAINE 2% (20 MG/ML) 5 ML SYRINGE
INTRAMUSCULAR | Status: DC | PRN
  Administered 2017-08-27: 100 mg via INTRAVENOUS

## 2017-08-27 MED ORDER — FENTANYL CITRATE (PF) 250 MCG/5ML IJ SOLN
INTRAMUSCULAR | Status: AC
  Filled 2017-08-27: qty 5

## 2017-08-27 MED ORDER — ACETAMINOPHEN 10 MG/ML IV SOLN
INTRAVENOUS | Status: AC
  Filled 2017-08-27: qty 100

## 2017-08-27 MED ORDER — ONDANSETRON HCL 4 MG/2ML IJ SOLN
INTRAMUSCULAR | Status: DC | PRN
  Administered 2017-08-27: 4 mg via INTRAVENOUS

## 2017-08-27 MED ORDER — PROPOFOL 10 MG/ML IV BOLUS
INTRAVENOUS | Status: AC
  Filled 2017-08-27: qty 20

## 2017-08-27 MED ORDER — BUPIVACAINE HCL (PF) 0.25 % IJ SOLN
INTRAMUSCULAR | Status: AC
  Filled 2017-08-27: qty 30

## 2017-08-27 MED ORDER — FENTANYL CITRATE (PF) 250 MCG/5ML IJ SOLN
INTRAMUSCULAR | Status: DC | PRN
  Administered 2017-08-27: 25 ug via INTRAVENOUS
  Administered 2017-08-27: 50 ug via INTRAVENOUS
  Administered 2017-08-27: 25 ug via INTRAVENOUS
  Administered 2017-08-27: 50 ug via INTRAVENOUS
  Administered 2017-08-27: 25 ug via INTRAVENOUS
  Administered 2017-08-27: 50 ug via INTRAVENOUS
  Administered 2017-08-27: 25 ug via INTRAVENOUS
  Administered 2017-08-27: 100 ug via INTRAVENOUS

## 2017-08-27 MED ORDER — PROMETHAZINE HCL 25 MG/ML IJ SOLN
6.2500 mg | INTRAMUSCULAR | Status: DC | PRN
Start: 2017-08-27 — End: 2017-08-27

## 2017-08-27 MED ORDER — HYDROMORPHONE HCL 2 MG/ML IJ SOLN
0.2500 mg | INTRAMUSCULAR | Status: DC | PRN
  Administered 2017-08-27 (×4): 0.5 mg via INTRAVENOUS

## 2017-08-27 MED ORDER — PNEUMOCOCCAL VAC POLYVALENT 25 MCG/0.5ML IJ INJ
0.5000 mL | INJECTION | INTRAMUSCULAR | Status: AC
  Administered 2017-08-28: 0.5 mL via INTRAMUSCULAR
  Filled 2017-08-27: qty 0.5

## 2017-08-27 MED ORDER — HYDROMORPHONE HCL 2 MG/ML IJ SOLN
0.5000 mg | Freq: Once | INTRAMUSCULAR | Status: AC
  Administered 2017-08-27: 0.5 mg via INTRAVENOUS
  Filled 2017-08-27: qty 1

## 2017-08-27 MED ORDER — BUPIVACAINE HCL (PF) 0.25 % IJ SOLN
INTRAMUSCULAR | Status: DC | PRN
  Administered 2017-08-27: 30 mL

## 2017-08-27 MED ORDER — MIDAZOLAM HCL 2 MG/2ML IJ SOLN
INTRAMUSCULAR | Status: DC | PRN
  Administered 2017-08-27: 2 mg via INTRAVENOUS

## 2017-08-27 SURGICAL SUPPLY — 42 items
BANDAGE ELASTIC 6 VELCRO ST LF (GAUZE/BANDAGES/DRESSINGS) ×3 IMPLANT
BLADE EXCALIBUR 4.0MM X 13CM (MISCELLANEOUS) ×1
BLADE EXCALIBUR 4.0X13 (MISCELLANEOUS) ×2 IMPLANT
CLOSURE WOUND 1/2 X4 (GAUZE/BANDAGES/DRESSINGS) ×1
CONT SPECI 4OZ STER CLIK (MISCELLANEOUS) ×3 IMPLANT
COVER SURGICAL LIGHT HANDLE (MISCELLANEOUS) ×3 IMPLANT
CUFF TOURNIQUET SINGLE 34IN LL (TOURNIQUET CUFF) ×3 IMPLANT
DRAPE ARTHROSCOPY W/POUCH 114 (DRAPES) ×3 IMPLANT
DRAPE PROXIMA HALF (DRAPES) ×3 IMPLANT
DRAPE U-SHAPE 47X51 STRL (DRAPES) ×3 IMPLANT
DRSG PAD ABDOMINAL 8X10 ST (GAUZE/BANDAGES/DRESSINGS) ×3 IMPLANT
DURAPREP 26ML APPLICATOR (WOUND CARE) ×3 IMPLANT
EVACUATOR 1/8 PVC DRAIN (DRAIN) ×3 IMPLANT
FACESHIELD WRAPAROUND (MASK) ×3 IMPLANT
FLUID NSS /IRRIG 3000 ML XXX (IV SOLUTION) ×12 IMPLANT
GAUZE SPONGE 4X4 12PLY STRL (GAUZE/BANDAGES/DRESSINGS) IMPLANT
GAUZE SPONGE 4X4 12PLY STRL LF (GAUZE/BANDAGES/DRESSINGS) ×3 IMPLANT
GLOVE BIOGEL PI IND STRL 8 (GLOVE) ×1 IMPLANT
GLOVE BIOGEL PI INDICATOR 8 (GLOVE) ×2
GLOVE ECLIPSE 8.0 STRL XLNG CF (GLOVE) ×6 IMPLANT
GOWN STRL REUS W/ TWL LRG LVL3 (GOWN DISPOSABLE) ×2 IMPLANT
GOWN STRL REUS W/ TWL XL LVL3 (GOWN DISPOSABLE) ×2 IMPLANT
GOWN STRL REUS W/TWL LRG LVL3 (GOWN DISPOSABLE) ×4
GOWN STRL REUS W/TWL XL LVL3 (GOWN DISPOSABLE) ×4
KIT TURNOVER KIT B (KITS) ×3 IMPLANT
MANIFOLD NEPTUNE II (INSTRUMENTS) ×3 IMPLANT
NS IRRIG 1000ML POUR BTL (IV SOLUTION) ×3 IMPLANT
PACK ARTHROSCOPY DSU (CUSTOM PROCEDURE TRAY) ×3 IMPLANT
PAD ARMBOARD 7.5X6 YLW CONV (MISCELLANEOUS) ×6 IMPLANT
PADDING CAST COTTON 6X4 STRL (CAST SUPPLIES) ×3 IMPLANT
SPONGE LAP 4X18 X RAY DECT (DISPOSABLE) ×3 IMPLANT
STRIP CLOSURE SKIN 1/2X4 (GAUZE/BANDAGES/DRESSINGS) ×2 IMPLANT
SUT ETHILON 3 0 PS 1 (SUTURE) ×3 IMPLANT
SUT MNCRL AB 4-0 PS2 18 (SUTURE) ×3 IMPLANT
SYR CONTROL 10ML LL (SYRINGE) ×3 IMPLANT
TOWEL OR 17X24 6PK STRL BLUE (TOWEL DISPOSABLE) ×3 IMPLANT
TOWEL OR 17X26 10 PK STRL BLUE (TOWEL DISPOSABLE) ×3 IMPLANT
TUBE CONNECTING 12'X1/4 (SUCTIONS) ×1
TUBE CONNECTING 12X1/4 (SUCTIONS) ×2 IMPLANT
TUBING ARTHROSCOPY IRRIG 16FT (MISCELLANEOUS) ×3 IMPLANT
WAND STAR VAC 90 (SURGICAL WAND) ×3 IMPLANT
WATER STERILE IRR 1000ML POUR (IV SOLUTION) ×3 IMPLANT

## 2017-08-27 NOTE — Anesthesia Procedure Notes (Signed)
Procedure Name: Intubation Date/Time: 08/27/2017 11:17 AM Performed by: Dairl Ponder, CRNA Pre-anesthesia Checklist: Patient identified, Emergency Drugs available, Suction available, Patient being monitored and Timeout performed Patient Re-evaluated:Patient Re-evaluated prior to induction Oxygen Delivery Method: Circle system utilized Preoxygenation: Pre-oxygenation with 100% oxygen Induction Type: IV induction LMA: LMA inserted LMA Size: 4.0 Number of attempts: 1 Placement Confirmation: positive ETCO2 and breath sounds checked- equal and bilateral Tube secured with: Tape Dental Injury: Teeth and Oropharynx as per pre-operative assessment

## 2017-08-27 NOTE — Op Note (Signed)
Orthopaedic Surgery Operative Note (CSN: [id number])  Larry Mason  Jul 15, 1975 Date of Surgery: 08/27/2017   Diagnoses:  INFECTED RIGHT KNEE, Recurrent  Procedure: R knee arthroscopic washout septic knee 57493 R knee arthroscopy for synovial biopsy 29870  Operative Finding Successful completion of planned procedure.  Mild and improved but present purulence noted within the joint, moderate inflammatory response.  See note from 4/13 with complete details on intraarticular findings  Post-operative plan: The patient will be WBAT, drain out tomorrow.  The patient will be admitted to floor for antibiotics.  DVT prophylaxis with ASA 81mg .  Pain control with PRN pain medication preferring oral medicines.  Follow up plan TBD.  Post-Op Diagnosis: Same Surgeons:Primary: Bjorn Pippin, MD Assistants: Location: [facility] OR ROOM 10 Anesthesia: General Antibiotics: Scheduled antibiotics continued with clindamycin Tourniquet time:  Total Tourniquet Time Documented: Thigh (Right) - 40 minutes Total: Thigh (Right) - 40 minutes  Estimated Blood Loss: minimal Complications: None Specimens: 1 for culture Implants: * No implants in log *  Indications for Surgery:   [name] is a 42 y.o. male with substance abuse and recurrent R knee infection sp I&D 3 days prior.  Benefits and risks of operative and nonoperative management were discussed prior to surgery with patient/guardian(s) and informed consent form was completed.  Specific risks including infection, need for additional surgery, continued infection, worsening postop arthritis  Procedure:   The patient was identified properly. Informed consent was obtained and the surgical site was marked. The patient was taken up to suite where general anesthesia was induced. The patient was placed in the supine position with a post against the surgical leg and a nonsterile tourniquet applied. The surgical leg was then prepped and draped usual sterile  fashion.  A standard surgical timeout was performed.  2 standard anterior portals were made and diagnostic arthroscopy performed. Please note the findings as noted above.  A superolateral portal was used for outflow.  We ran 10L of fluid through the knee and performed a subtotal synovectomy with the shaver.  We took fluid samples for culture x 1 and a synovial biopsy was performed for culture.    Incisions closed with absorbable suture and a drain was placed under arthroscopic guidance with 20 holes left on the drain through the superolaetral portal.  The patient was awoken from general anesthesia and taken to the PACU in stable condition without complication.

## 2017-08-27 NOTE — H&P (View-Only) (Signed)
ORTHOPAEDIC PROGRESS NOTE  s/p Procedure(s): ARTHROSCOPY KNEE IRRIGATION AND DEBRIDEMENT  SUBJECTIVE: Much more pain after drain pulled.   OBJECTIVE: PE:RLE:  distal motor and sensory preserved, dressing CDI  Vitals:   08/26/17 1942 08/27/17 0409  BP: (!) 162/74 (!) 177/97  Pulse: 60 99  Resp: 18 (!) 21  Temp: 99 F (37.2 C) 98.4 F (36.9 C)  SpO2: 94% 95%     ASSESSMENT: Bion M Raggio is a 42 y.o. male with concern for continued infection  Plan for OR today at 1100.  Discussed consent again with patient including specific risks of repeat infection, continued arthritis and need for further procedures in addition to other discussions.  Patient elected to proceed.   PLAN: Weightbearing: WBAT RLE Insicional and dressing care: drain out Orthopedic device(s): None Showering: after wound reassessed tomorrow VTE prophylaxis: Aspirin 81mg qd 6 weeks Pain control: prn meds, minimize narcs Follow - up plan: 2 weeks for wound check Contact information:  Weekdays 8-5 Dax Varkey MD [phone], After hours and holidays please check [email or web address] for group call information for [facility]   

## 2017-08-27 NOTE — Interval H&P Note (Signed)
Discussed case, risks and benefits with patient again.  All questions answered, no change to history.  Meika Earll MD  

## 2017-08-27 NOTE — Progress Notes (Signed)
Family Medicine Teaching Service Daily Progress Note Intern Pager: [phone]  Patient name: Larry Mason Medical record number: [id number] Date of birth: [birth year] Age: 42 y.o. Gender: male  Primary Care Provider: Patient, No Pcp Per Consultants: orthopedics Code Status: full  Pt Overview and Major Events to Date:  4/12 - admit FPTS 4/13 - I&D/arthoscopy by ortho  Assessment and Plan: IZEAR PINE is a 42 y.o. male presenting with right knee pain found to have a septic right knee. PMH is significant for T2DM, HFrEF (20-25% EF), polysubstance abuse  Septic arthritis, right knee - S/p arthroscopy and washout 4/13. Rare Serratia marcescens, sensitive to Cipro, CTX. Patient remains afebrile, although hypertensive overnight. Received one dose prn tramadol overnight. Drain removed yesterday with noted purulent material, may require additional washout per Ortho. R knee wrapped with purulent drainage on dressing. Ortho evaluated this morning and plan for additional washout today. CRP and ESR elevated this morning at 17.2, 131, respectively. ID consulted yesterday who recommended transition to Cipro for 6 week course and hepatitis screen (Hep B/C negative).  - follow up ortho recs, appreciate care --> repeat washout this morning - NPO  - s/p Vancomycin (4/12-4/14), Zosyn (4/12-4/15), CTX (4/15) - continue Cipro (4/15- ) for 6 weeks - ID following, appreciate recs, will follow outpatient.  - Will schedule tylenol 650 TID, continue tramadol PRN pain - Try to limit IV pain medication  - monitor fever curve - vitals per unit - PT - recommends HHPT or outpt f/u  HFrEF - Cardiac echo 07/2014 notable for EF 20-25%, no wall motion abnormalities. Euvolemic. Reports he follows with Dr. Terrence Dupont and last saw him about 2 months ago. Home meds include lisinopril 10 mg daily, spiro 25 mg daily, coreg 25 mg BID.  - continue home meds, increase lisinopril to 68m given hypertension overnight - monitor  BP   Diabetes - home meds include glimepiride 1 mg daily. Last A1c documented in epic is 6.8 from 07/2014. Patient does not recall if there has been one more recently. CBG this am 146. 4u total SSI received in 24 hours. - sensitive sliding scale insulin with QAC/QHS CBGs   Polysubstance abuse - cocaine and heroin noted on problem list. Concern with hx cocaine abuse and B-blocker but attending discussed with patient 4/13. Patient adamant he would not use drugs while taking coreg. UDS +cocaine. - Limit IV pain medication as able  FEN/GI: NPO until surgery Prophylaxis: asa per surgery, SCDs  Disposition: to OR for additional wash out today  Subjective:  Patient with some pain, states feels knee is filling back up. Did not eat breakfast this morning in preparation for surgery.  Objective: Temp:  [98.4 F (36.9 C)-99 F (37.2 C)] 98.4 F (36.9 C) (04/16 0409) Pulse Rate:  [60-99] 99 (04/16 0409) Resp:  [18-21] 21 (04/16 0409) BP: (160-177)/(74-97) 177/97 (04/16 0409) SpO2:  [94 %-96 %] 95 % (04/16 0409) Physical Exam: General: lying in bed, in mild distress Cardiovascular: RRR, no murmurs/rubs/gallops Respiratory: CTAB, no wheezes/rales/rhonchi Abdomen: obese abdomen, +BS, soft, nontender Extremities: Right LE in compression wrap, dressing in place, saturated with purulent drainage. Feet warm with good DP pulses.  Laboratory: Recent Labs  Lab 08/25/17 0732 08/25/17 1923 08/26/17 0636  WBC 12.8* 10.7* 9.9  HGB 8.6* 9.5* 9.4*  HCT 29.0* 31.7* 30.6*  PLT 318 345 323   Recent Labs  Lab 08/24/17 0554 08/25/17 0732 08/26/17 0636  NA 136 137 141  K 4.3 3.8 3.8  CL 101 99* 103  CO2 26 28 28   BUN 12 10 11   CREATININE 0.64 0.73 0.59*  CALCIUM 8.7* 8.3* 8.7*  GLUCOSE 108* 157* 108*   UDS +cocaine CRP 19  R knee arthocentesis resulted in 143k WBCs, 89 neutorphils, turbid, no organisms. Surgical culture growing rare Serratia.  Imaging/Diagnostic Tests: Dg Knee Complete  4 Views Right  Result Date: 08/23/2017 CLINICAL DATA:  RIGHT knee pain and swelling for 2 days, no known injury EXAM: RIGHT KNEE - COMPLETE 4+ VIEW COMPARISON:  MR RIGHT knee 03/22/2007 FINDINGS: Low normal osseous mineralization. Tricompartmental joint space narrowing and spur formation. Bone-on-bone appearance at medial compartment. No acute fracture, dislocation, or bone destruction. Probable calcified loose body posteriorly. Large joint effusion. Remaining soft tissues unremarkable. 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 No definite acute bony abnormalities. Electronically Signed   By: Lavonia Dana M.D.   On: 08/23/2017 15:35   Rory Percy, DO 08/27/2017, 7:37 AM PGY-1, [facility] Intern pager: [phone], text pages welcome

## 2017-08-27 NOTE — Plan of Care (Signed)
  Problem: Health Behavior/Discharge Planning: Goal: Ability to manage health-related needs will improve Outcome: Progressing   Problem: Nutrition: Goal: Adequate nutrition will be maintained Reactivated   Problem: Pain Managment: Goal: General experience of comfort will improve Reactivated   Problem: Safety: Goal: Ability to remain free from injury will improve Reactivated   Problem: Skin Integrity: Goal: Risk for impaired skin integrity will decrease Outcome: Progressing

## 2017-08-27 NOTE — Transfer of Care (Signed)
Immediate Anesthesia Transfer of Care Note  Patient: Larry Mason  Procedure(s) Performed: ARTHROSCOPY AND I&D RIGHT KNEE (Right Knee)  Patient Location: PACU  Anesthesia Type:General  Level of Consciousness: awake, alert  and oriented  Airway & Oxygen Therapy: Patient Spontanous Breathing and Patient connected to nasal cannula oxygen  Post-op Assessment: Report given to RN and Post -op Vital signs reviewed and stable  Post vital signs: Reviewed and stable  Last Vitals:  Vitals Value Taken Time  BP 134/87 08/27/2017 12:29 PM  Temp 36.7 C 08/27/2017 12:30 PM  Pulse 86 08/27/2017 12:33 PM  Resp 15 08/27/2017 12:33 PM  SpO2 95 % 08/27/2017 12:33 PM  Vitals shown include unvalidated device data.  Last Pain:  Vitals:   08/27/17 0940  TempSrc:   PainSc: 8       Patients Stated Pain Goal: 3 (08/27/17 0313)  Complications: No apparent anesthesia complications

## 2017-08-27 NOTE — Care Management Note (Signed)
Case Management Note  Patient Details  Name: Larry Mason MRN: [id number] Date of Birth: [birth year]  Subjective/Objective:  42 yr old gentleman s/p I & D of   right septic knee.                    Action/Plan: Patient is uninsured, will need MATCH Program for assistance Cipro 500 mg BID for 42 days.    Expected Discharge Date:            Expected Discharge Plan:  Home/Self Care  In-House Referral:  NA  Discharge planning Services  CM Consult, MATCH Program, Medication Assistance  Post Acute Care Choice:  NA Choice offered to:  NA  DME Arranged:  N/A DME Agency:  NA  HH Arranged: PT   HH Agency: [facility] Status of Service:  In process, will continue to follow  If discussed at Long Length of Stay Meetings, dates discussed:    Additional Comments:  Durenda Guthrie, RN 08/27/2017, 10:13 AM

## 2017-08-27 NOTE — Anesthesia Postprocedure Evaluation (Signed)
Anesthesia Post Note  Patient: Larry Mason  Procedure(s) Performed: ARTHROSCOPY AND I&D RIGHT KNEE (Right Knee)     Patient location during evaluation: PACU Anesthesia Type: General Level of consciousness: sedated Pain management: pain level controlled Vital Signs Assessment: post-procedure vital signs reviewed and stable Respiratory status: spontaneous breathing and respiratory function stable Cardiovascular status: stable Postop Assessment: no apparent nausea or vomiting Anesthetic complications: no    Last Vitals:  Vitals:   08/27/17 1327 08/27/17 1338  BP:  (!) 139/103  Pulse:  99  Resp:  16  Temp: 36.6 C 36.6 C  SpO2:  95%    Last Pain:  Vitals:   08/27/17 1338  TempSrc: Oral  PainSc:                  Jovi Alvizo DANIEL

## 2017-08-27 NOTE — Anesthesia Preprocedure Evaluation (Addendum)
Anesthesia Evaluation  Patient identified by MRN, date of birth, ID band Patient awake    Reviewed: Allergy & Precautions, H&P , NPO status , Patient's Chart, lab work & pertinent test results, reviewed documented beta blocker date and time   History of Anesthesia Complications Negative for: history of anesthetic complications  Airway Mallampati: III  TM Distance: >3 FB Neck ROM: Full    Dental no notable dental hx. (+) Teeth Intact, Dental Advisory Given   Pulmonary Current Smoker,    Pulmonary exam normal        Cardiovascular Exercise Tolerance: Good hypertension, Pt. on medications and Pt. on home beta blockers + Past MI and +CHF  Normal cardiovascular exam     Neuro/Psych negative neurological ROS  negative psych ROS   GI/Hepatic negative GI ROS, Neg liver ROS,   Endo/Other  diabetes, Type 2, Oral Hypoglycemic AgentsMorbid obesity  Renal/GU negative Renal ROS  negative genitourinary   Musculoskeletal  (+) Arthritis , Osteoarthritis,    Abdominal   Peds  Hematology negative hematology ROS (+)   Anesthesia Other Findings   Reproductive/Obstetrics negative OB ROS                            Anesthesia Physical  Anesthesia Plan  ASA: III  Anesthesia Plan: General   Post-op Pain Management:    Induction: Intravenous  PONV Risk Score and Plan: 2 and Ondansetron, Dexamethasone, Scopolamine patch - Pre-op and Treatment may vary due to age or medical condition  Airway Management Planned: LMA  Additional Equipment:   Intra-op Plan:   Post-operative Plan: Extubation in OR  Informed Consent: I have reviewed the patients History and Physical, chart, labs and discussed the procedure including the risks, benefits and alternatives for the proposed anesthesia with the patient or authorized representative who has indicated his/her understanding and acceptance.   Dental advisory  given  Plan Discussed with: CRNA and Anesthesiologist  Anesthesia Plan Comments:        Anesthesia Quick Evaluation

## 2017-08-27 NOTE — Progress Notes (Signed)
ORTHOPAEDIC PROGRESS NOTE  s/p Procedure(s): ARTHROSCOPY KNEE IRRIGATION AND DEBRIDEMENT  SUBJECTIVE: Much more pain after drain pulled.   OBJECTIVE: PE:RLE:  distal motor and sensory preserved, dressing CDI  Vitals:   08/26/17 1942 08/27/17 0409  BP: (!) 162/74 (!) 177/97  Pulse: 60 99  Resp: 18 (!) 21  Temp: 99 F (37.2 C) 98.4 F (36.9 C)  SpO2: 94% 95%     ASSESSMENT: Larry Mason is a 42 y.o. male with concern for continued infection  Plan for OR today at 1100.  Discussed consent again with patient including specific risks of repeat infection, continued arthritis and need for further procedures in addition to other discussions.  Patient elected to proceed.   PLAN: Weightbearing: WBAT RLE Insicional and dressing care: drain out Orthopedic device(s): None Showering: after wound reassessed tomorrow VTE prophylaxis: Aspirin 81mg  qd 6 weeks Pain control: prn meds, minimize narcs Follow - up plan: 2 weeks for wound check Contact information:  Weekdays 8-5 Ramond Marrow MD [phone], After hours and holidays please check [email or web address] for group call information for [facility]

## 2017-08-28 ENCOUNTER — Encounter (HOSPITAL_COMMUNITY): Payer: Self-pay | Admitting: Orthopaedic Surgery

## 2017-08-28 LAB — CBC
HCT: 29.4 % — ABNORMAL LOW (ref 39.0–52.0)
HEMOGLOBIN: 9.2 g/dL — AB (ref 13.0–17.0)
MCH: 26.6 pg (ref 26.0–34.0)
MCHC: 31.3 g/dL (ref 30.0–36.0)
MCV: 85 fL (ref 78.0–100.0)
Platelets: 455 10*3/uL — ABNORMAL HIGH (ref 150–400)
RBC: 3.46 MIL/uL — AB (ref 4.22–5.81)
RDW: 14.9 % (ref 11.5–15.5)
WBC: 11.1 10*3/uL — ABNORMAL HIGH (ref 4.0–10.5)

## 2017-08-28 LAB — BASIC METABOLIC PANEL
ANION GAP: 12 (ref 5–15)
BUN: 7 mg/dL (ref 6–20)
CHLORIDE: 101 mmol/L (ref 101–111)
CO2: 24 mmol/L (ref 22–32)
CREATININE: 0.67 mg/dL (ref 0.61–1.24)
Calcium: 8.8 mg/dL — ABNORMAL LOW (ref 8.9–10.3)
GFR calc non Af Amer: >60 mL/min (ref >60)
Glucose, Bld: 204 mg/dL — ABNORMAL HIGH (ref 65–99)
POTASSIUM: 3.3 mmol/L — AB (ref 3.5–5.1)
SODIUM: 137 mmol/L (ref 135–145)

## 2017-08-28 LAB — GLUCOSE, CAPILLARY
GLUCOSE-CAPILLARY: 186 mg/dL — AB (ref 65–99)
GLUCOSE-CAPILLARY: 178 mg/dL — AB (ref 65–99)
GLUCOSE-CAPILLARY: 125 mg/dL — AB (ref 65–99)
GLUCOSE-CAPILLARY: 125 mg/dL — AB (ref 65–99)
Glucose-Capillary: 149 mg/dL — ABNORMAL HIGH (ref 65–99)

## 2017-08-28 MED ORDER — POTASSIUM CHLORIDE CRYS ER 20 MEQ PO TBCR
40.0000 meq | EXTENDED_RELEASE_TABLET | Freq: Two times a day (BID) | ORAL | Status: AC
  Administered 2017-08-28 (×2): 40 meq via ORAL
  Filled 2017-08-28 (×2): qty 2

## 2017-08-28 MED ORDER — HYDROMORPHONE HCL 2 MG/ML IJ SOLN
0.5000 mg | Freq: Once | INTRAMUSCULAR | Status: AC
  Administered 2017-08-28: 0.5 mg via INTRAVENOUS
  Filled 2017-08-28 (×2): qty 1

## 2017-08-28 NOTE — Plan of Care (Signed)
  Problem: Pain Managment: Goal: General experience of comfort will improve Outcome: Progressing   Problem: Safety: Goal: Ability to remain free from injury will improve Outcome: Progressing   Problem: Clinical Measurements: Goal: Postoperative complications will be avoided or minimized Outcome: Progressing   

## 2017-08-28 NOTE — Progress Notes (Signed)
ORTHOPAEDIC PROGRESS NOTE  s/p Procedure(s): ARTHROSCOPY KNEE IRRIGATION AND DEBRIDEMENT  SUBJECTIVE: Drain has had some output, patient reports more pain than last night.  OBJECTIVE: PE:RLE:  distal motor and sensory preserved, dressing CDI  Vitals:   08/28/17 0701 08/28/17 1005  BP: (!) 149/85   Pulse: 93 86  Resp: 15   Temp: 97.7 F (36.5 C)   SpO2: 92% 99%     ASSESSMENT: Larry Mason is a 42 y.o. male with appropriate postop appearance  Drain out, will monitor for 24hrs at least to make sure somewhat improving prior to being appropriate for DC   PLAN: Weightbearing: WBAT RLE Insicional and dressing care: drain out Orthopedic device(s): None Showering: after wound reassessed tomorrow VTE prophylaxis: Aspirin 81mg  qd 6 weeks Pain control: prn meds, minimize narcs Follow - up plan: 2 weeks for wound check Contact information:  Weekdays 8-5 Ramond Marrow MD [phone], After hours and holidays please check [email or web address] for group call information for [facility]

## 2017-08-28 NOTE — Progress Notes (Signed)
Physical Therapy Treatment Patient Details Name: Larry Mason MRN: [id number] DOB: 09/29/1975 Today's Date: 08/28/2017    History of Present Illness Pt is a 42 yo male admitted through ED with left knee pain. Pt was found to have an infection in his right knee & underwent an I&D yesterday 08/24/17. PMH significant for CHF, polysubstance abuse (cocaine, heroin), Dm2, obesity, MI.     PT Comments    Pt appears in intense pain visibly upset and moaning with movement.  Pt only able to tolerate bed to chair with short trial of gait training.  Informed patient that he is not safe to use crutches at this  Time due to decreased strength to use them properly.  Will inform CM of need for RW at d/c.  Pt will likely remain appropriate to return home with support from his family once pain is better controlled.      Follow Up Recommendations  Other (comment)(HHPT or OP.  )     Equipment Recommendations  Rolling walker with 5" wheels(bariatric)    Recommendations for Other Services       Precautions / Restrictions Precautions Precautions: Fall Restrictions Weight Bearing Restrictions: No    Mobility  Bed Mobility Overal bed mobility: Modified Independent             General bed mobility comments: able to get EOB without assistance, increased time and effort noted due to increased pain.    Transfers Overall transfer level: Needs assistance Equipment used: Rolling walker (2 wheeled) Transfers: Sit to/from Stand           General transfer comment: Min guard for safety, poor hand placement to and from seated surface.  Pt not following commands due to increased pain and not processing what PTA is instructing to improve safety.    Ambulation/Gait Ambulation/Gait assistance: Min assist;Mod assist(Increased assistance needed due to increased pain.  ) Ambulation Distance (Feet): 6 Feet(from bed to chair.  Pt unable to progress further due to pain and poor safety when performing gait  training.  ) Assistive device: Rolling walker (2 wheeled)(Instructed in use of RW as patient is requiring increased assistance and he is not safe to mobillize with crutches.  Pt is heavily reliant on UEs for support.  ) Gait Pattern/deviations: Step-to pattern;Antalgic;Decreased step length - left;Decreased stance time - right;Decreased dorsiflexion - right Gait velocity: decreased   General Gait Details: Pt flexed with gait training, R buckling noted and poor UE strength observed.  Pt is deconditioned and limited due to strength deficits and increased pain.     Stairs             Wheelchair Mobility    Modified Rankin (Stroke Patients Only)       Balance Overall balance assessment: Needs assistance   Sitting balance-Leahy Scale: Good       Standing balance-Leahy Scale: Poor Standing balance comment: Pt heavily reliant on UE support and buckling noted in R stance phase.                              Cognition Arousal/Alertness: Awake/alert Behavior During Therapy: Anxious;Restless Overall Cognitive Status: Within Functional Limits for tasks assessed                                        Exercises      General Comments  Pertinent Vitals/Pain Pain Assessment: 0-10 Pain Score: 10-Worst pain ever Pain Location: right knee Pain Descriptors / Indicators: Burning;Discomfort;Sharp Pain Intervention(s): Monitored during session;Repositioned;Patient requesting pain meds-RN notified;Limited activity within patient's tolerance    Home Living                      Prior Function            PT Goals (current goals can now be found in the care plan section) Acute Rehab PT Goals Patient Stated Goal: to be able to straighten his knee Potential to Achieve Goals: Good Progress towards PT goals: Progressing toward goals    Frequency    Min 3X/week      PT Plan Current plan remains appropriate    Co-evaluation               AM-PAC PT "6 Clicks" Daily Activity  Outcome Measure  Difficulty turning over in bed (including adjusting bedclothes, sheets and blankets)?: None Difficulty moving from lying on back to sitting on the side of the bed? : None Difficulty sitting down on and standing up from a chair with arms (e.g., wheelchair, bedside commode, etc,.)?: Unable Help needed moving to and from a bed to chair (including a wheelchair)?: A Little Help needed walking in hospital room?: A Lot Help needed climbing 3-5 steps with a railing? : A Lot 6 Click Score: 16    End of Session Equipment Utilized During Treatment: Gait belt Activity Tolerance: Patient limited by pain Patient left: in chair;with call bell/phone within reach Nurse Communication: Mobility status PT Visit Diagnosis: Unsteadiness on feet (R26.81);Other abnormalities of gait and mobility (R26.89);Pain Pain - Right/Left: Right Pain - part of body: Knee     Time: 1610-9604 PT Time Calculation (min) (ACUTE ONLY): 21 min  Charges:  $Therapeutic Activity: 8-22 mins                    G Codes:       Joycelyn Rua, PTA pager [phone]    Florestine Avers 08/28/2017, 2:18 PM

## 2017-08-28 NOTE — Progress Notes (Addendum)
Family Medicine Teaching Service Daily Progress Note Intern Pager: [phone]  Patient name: Larry Mason Medical record number: [id number] Date of birth: December 02, 1975 Age: 42 y.o. Gender: male  Primary Care Provider: Patient, No Pcp Per Consultants: orthopedics Code Status: full  Pt Overview and Major Events to Date:  4/12 - admit FPTS 4/13 - I&D/arthoscopy by ortho  Assessment and Plan: Larry Mason is a 42 y.o. male presenting with right knee pain found to have a septic right knee. PMH is significant for T2DM, HFrEF (20-25% EF), polysubstance abuse  Septic arthritis, right knee - S/p arthroscopy and washout 4/13 and 4/16. Remains afebrile, slightly hypertensive overnight, most likely due to pain. Received one dose 0.5mg  dilaudid at 1720 4/16 and one dose tramadol early this morning. Was in severe pain on exam this morning, provided additional dose of diluadid 0.5mg . Drain in place with bloody output. WBC elevated yesterday, improved today. Repeat wound cultures collected 4/16, NGx1d. - follow up ortho recs, appreciate care  - s/p Vancomycin (4/12-4/14), Zosyn (4/12-4/15), CTX (4/15) - continue Cipro (4/15- ) for 6 weeks - ID following, appreciate recs, will follow outpatient.  - Will schedule tylenol 650 TID, continue tramadol PRN pain - Try to limit IV pain medication  - monitor fever curve - vitals per unit - PT - recommends HHPT or outpt f/u - f/u repeat wound cultures  Hypertension Hypertensive overnight despite increasing lisinopril dose. SBP 140-160s, likely elevated due to pain. - continue lisinopril 20mg   Hypokalemia K 3.3 this morning - replete and recheck in am  HFrEF - Cardiac echo 07/2014 notable for EF 20-25%, no wall motion abnormalities. Euvolemic. Reports he follows with Dr. Sharyn Lull and last saw him about 2 months ago. Home meds include lisinopril 10 mg daily, spiro 25 mg daily, coreg 25 mg BID.  - continue home meds, with increased lisinopril 20mg   -  monitor BP   Diabetes - home meds include glimepiride 1 mg daily. Last A1c documented in epic is 6.8 from 07/2014. Patient does not recall if there has been one more recently. CBG this am 186. 5u total SSI received in 24 hours. - sensitive sliding scale insulin with QAC/QHS CBGs  - provide metformin on d/c  Polysubstance abuse - cocaine and heroin noted on problem list. Concern with hx cocaine abuse and B-blocker but attending discussed with patient 4/13. Patient adamant he would not use drugs while taking coreg. UDS +cocaine. - Limit IV pain medication as able  FEN/GI: heart healthy/carb modified Prophylaxis: asa per surgery, SCDs  Disposition: pending ortho recs  Subjective:  Patient with much pain this morning, writhing in bed.  Objective: Temp:  [97.7 F (36.5 C)-98.4 F (36.9 C)] 97.7 F (36.5 C) (04/17 0701) Pulse Rate:  [83-104] 93 (04/17 0701) Resp:  [12-21] 15 (04/17 0701) BP: (134-168)/(57-103) 149/85 (04/17 0701) SpO2:  [92 %-98 %] 92 % (04/17 0701) Physical Exam: General: lying in bed, in much distress from pain, writhing in bed Cardiovascular: RRR, no murmurs Respiratory: CTAB, slight wheezing heard at bases bilaterally Abdomen: obese abdomen, +BS, soft, nontender Extremities: Right LE in compression wrap, drain in place with bloody output. Feet warm, well perfused, able to move extremities spontaneously.  Laboratory: Recent Labs  Lab 08/25/17 1923 08/26/17 0636 08/27/17 0428  WBC 10.7* 9.9 12.1*  HGB 9.5* 9.4* 8.7*  HCT 31.7* 30.6* 28.2*  PLT 345 323 424*   Recent Labs  Lab 08/25/17 0732 08/26/17 0636 08/27/17 0428  NA 137 141 140  K 3.8 3.8  4.6  CL 99* 103 103  CO2 28 28 26   BUN 10 11 9   CREATININE 0.73 0.59* 0.62  CALCIUM 8.3* 8.7* 8.7*  GLUCOSE 157* 108* 118*   UDS +cocaine CRP 19  R knee arthocentesis resulted in 143k WBCs, 89 neutorphils, turbid, no organisms. Surgical culture growing rare Serratia.  Imaging/Diagnostic Tests: Dg  Knee Complete 4 Views Right  Result Date: 08/23/2017 CLINICAL DATA:  RIGHT knee pain and swelling for 2 days, no known injury EXAM: RIGHT KNEE - COMPLETE 4+ VIEW COMPARISON:  MR RIGHT knee 03/22/2007 FINDINGS: Low normal osseous mineralization. Tricompartmental joint space narrowing and spur formation. Bone-on-bone appearance at medial compartment. No acute fracture, dislocation, or bone destruction. Probable calcified loose body posteriorly. Large joint effusion. Remaining soft tissues unremarkable. 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 No definite acute bony abnormalities. Electronically Signed   By: Ulyses Southward M.D.   On: 08/23/2017 15:35   Ellwood Dense, DO 08/28/2017, 7:28 AM PGY-1, [facility] FPTS Intern pager: [phone], text pages welcome

## 2017-08-29 LAB — GLUCOSE, CAPILLARY
GLUCOSE-CAPILLARY: 131 mg/dL — AB (ref 65–99)
Glucose-Capillary: 147 mg/dL — ABNORMAL HIGH (ref 65–99)

## 2017-08-29 LAB — BASIC METABOLIC PANEL
ANION GAP: 10 (ref 5–15)
BUN: 11 mg/dL (ref 6–20)
CHLORIDE: 104 mmol/L (ref 101–111)
CO2: 25 mmol/L (ref 22–32)
Calcium: 8.8 mg/dL — ABNORMAL LOW (ref 8.9–10.3)
Creatinine, Ser: 0.65 mg/dL (ref 0.61–1.24)
GFR calc non Af Amer: >60 mL/min (ref >60)
Glucose, Bld: 135 mg/dL — ABNORMAL HIGH (ref 65–99)
POTASSIUM: 3.9 mmol/L (ref 3.5–5.1)
SODIUM: 139 mmol/L (ref 135–145)

## 2017-08-29 LAB — AEROBIC/ANAEROBIC CULTURE (SURGICAL/DEEP WOUND)

## 2017-08-29 LAB — AEROBIC/ANAEROBIC CULTURE W GRAM STAIN (SURGICAL/DEEP WOUND)

## 2017-08-29 LAB — CBC
HEMATOCRIT: 30.8 % — AB (ref 39.0–52.0)
HEMOGLOBIN: 9.4 g/dL — AB (ref 13.0–17.0)
MCH: 26 pg (ref 26.0–34.0)
MCHC: 30.5 g/dL (ref 30.0–36.0)
MCV: 85.1 fL (ref 78.0–100.0)
Platelets: 508 10*3/uL — ABNORMAL HIGH (ref 150–400)
RBC: 3.62 MIL/uL — AB (ref 4.22–5.81)
RDW: 14.7 % (ref 11.5–15.5)
WBC: 11.7 10*3/uL — ABNORMAL HIGH (ref 4.0–10.5)

## 2017-08-29 MED ORDER — LISINOPRIL 20 MG PO TABS: 20.0000 mg | ORAL_TABLET | Freq: Every day | ORAL | 0 refills | Status: AC

## 2017-08-29 MED ORDER — METFORMIN HCL 500 MG PO TABS: 500.0000 mg | ORAL_TABLET | Freq: Every day | ORAL | 11 refills | Status: AC

## 2017-08-29 MED ORDER — CIPROFLOXACIN HCL 500 MG PO TABS: 500.0000 mg | ORAL_TABLET | Freq: Two times a day (BID) | ORAL | 0 refills | Status: AC

## 2017-08-29 MED ORDER — CIPROFLOXACIN HCL 500 MG PO TABS: 500.0000 mg | ORAL_TABLET | Freq: Two times a day (BID) | ORAL | 0 refills | Status: DC

## 2017-08-29 MED ORDER — ASPIRIN 81 MG PO TBEC: 81.0000 mg | DELAYED_RELEASE_TABLET | Freq: Every day | ORAL | 0 refills | Status: AC

## 2017-08-29 MED ORDER — TRAMADOL HCL 50 MG PO TABS: 50.0000 mg | ORAL_TABLET | Freq: Four times a day (QID) | ORAL | 0 refills | Status: AC | PRN

## 2017-08-29 NOTE — Care Management Note (Signed)
Case Management Note  Patient Details  Name: Larry Mason MRN: [id number] Date of Birth: [birth year]  Subjective/Objective:   42 yr old gentleman admitted with right septic knee, patient underwent I&D with washout x 2.             Action/Plan:  Patient is uninsured, has been setup with [facility] for Home Health PT. Case manager has arranged follow-up appointment for patient at the [facility] for Wednesday, Sep 25, 2017 at 2:10pm. Case manager provided patient with [facility] letter for Cipro 500mg  BID for 42 days.    Expected Discharge Date:   08/29/17               Expected Discharge Plan:  Home w Home Health Services  In-House Referral:  NA  Discharge planning Services  CM Consult, MATCH Program, Medication Assistance, Indigent Health Clinic  Post Acute Care Choice:  Home Health, Durable Medical Equipment Choice offered to:  NA  DME Arranged:  Walker rolling DME Agency:  [facility]  HH Arranged:  PT [facility] Agency:  [facility]  Status of Service:  Completed, signed off  If discussed at Long Length of Stay Meetings, dates discussed:    Additional Comments:  Durenda Guthrie, RN 08/29/2017, 1:27 PM

## 2017-08-29 NOTE — Progress Notes (Signed)
ORTHOPAEDIC PROGRESS NOTE  s/p Procedure(s): ARTHROSCOPY KNEE IRRIGATION AND DEBRIDEMENT  SUBJECTIVE: Patient much improved from yesterday, pain controlled well.  OBJECTIVE: PE:RLE:  distal motor and sensory preserved, knee with moderate effusion but improved from prior.  Vitals:   08/28/17 2041 08/29/17 0413  BP: (!) 156/94 (!) 147/92  Pulse: 93 91  Resp: 20 19  Temp: 98.4 F (36.9 C) 99.5 F (37.5 C)  SpO2: 93% 94%     ASSESSMENT: CAVALLI PACIS is a 42 y.o. male with appropriate postop appearance  OK for dc from ortho perspective.  Knee much improved.  PLAN: Weightbearing: WBAT RLE Insicional and dressing care: Dry dressing PRN.  Steri strips in place till 1 week postop at least. Orthopedic device(s): None Showering: ok to shower, do not soak knee. VTE prophylaxis: Aspirin 81mg  qd 6 weeks Pain control: prn meds, minimize narcs Follow - up plan: 2 weeks for wound check, patient should call for appointment. Contact information:  Weekdays 8-5 Ramond Marrow MD [phone], After hours and holidays please check [email or web address] for group call information for [facility]

## 2017-08-29 NOTE — Progress Notes (Signed)
Family Medicine Teaching Service Daily Progress Note Intern Pager: [phone]  Patient name: Larry Mason Medical record number: [id number] Date of birth: 11-Sep-1975 Age: 42 y.o. Gender: male  Primary Care Provider: Patient, No Pcp Per Consultants: orthopedics Code Status: full  Pt Overview and Major Events to Date:  4/12 - admit FPTS 4/13 - I&D/arthoscopy by ortho  Assessment and Plan: Larry Mason is a 42 y.o. male presenting with right knee pain found to have a septic right knee. PMH is significant for T2DM, HFrEF (20-25% EF), polysubstance abuse  Septic arthritis, right knee - S/p arthroscopy and washout 4/13 and 4/16. Remains afebrile, remains slightly hypertensive overnight. Repeat wound cultures collected 4/16, no growth yet, reincubated for growth. Drain removed yesterday, per ortho can d/c today with follow up in 2 weeks. - s/p Vancomycin (4/12-4/14), Zosyn (4/12-4/15), CTX (4/15) - continue Cipro (4/15- ) for 6 weeks - ID following, appreciate recs, will follow outpatient.  - Will schedule tylenol 650 TID, continue tramadol PRN pain - Try to limit IV pain medication  - monitor fever curve - vitals per unit - PT - recommends HHPT or outpt f/u - f/u repeat wound cultures  Hypertension Hypertensive overnight. SBP 140-150s, likely elevated due to pain. - continue lisinopril 20mg   Hypokalemia, resolved K 3.9 this morning - recheck in am  HFrEF - Cardiac echo 07/2014 notable for EF 20-25%, no wall motion abnormalities. Euvolemic. Reports he follows with Dr. Sharyn Lull and last saw him about 2 months ago. Home meds include lisinopril 10 mg daily, spiro 25 mg daily, coreg 25 mg BID.  - continue home meds, with increased lisinopril 20mg   - monitor BP   Diabetes - home meds include glimepiride 1 mg daily. Last A1c documented in epic is 6.8 from 07/2014. Patient does not recall if there has been one more recently. CBG this am 147. 5u total SSI received in 24 hours. -  sensitive sliding scale insulin with QAC/QHS CBGs  - provide metformin on d/c  Polysubstance abuse - cocaine and heroin noted on problem list. Concern with hx cocaine abuse and B-blocker but attending discussed with patient 4/13. Patient adamant he would not use drugs while taking coreg. UDS +cocaine. - Limit IV pain medication as able  FEN/GI: heart healthy/carb modified Prophylaxis: asa per surgery, SCDs  Disposition: d/c today  Subjective:  Patient still in pain today. Recently received tramadol.  Objective: Temp:  [98.4 F (36.9 C)-99.5 F (37.5 C)] 99.5 F (37.5 C) (04/18 0413) Pulse Rate:  [91-94] 91 (04/18 0413) Resp:  [17-20] 19 (04/18 0413) BP: (130-156)/(71-94) 147/92 (04/18 0413) SpO2:  [90 %-94 %] 94 % (04/18 0413) Physical Exam: General: lying in bed, in mild distress Cardiovascular: RRR, no mrg Respiratory: CTAB, no wheezes/rales/rhonchi Abdomen: obese abdomen, +BS, soft, nontender Extremities: RLE wrapped, steri strips in place. No erythema. Feet warm, well perfused, able to move extremities spontaneously.  Laboratory: Recent Labs  Lab 08/27/17 0428 08/28/17 0759 08/29/17 0555  WBC 12.1* 11.1* 11.7*  HGB 8.7* 9.2* 9.4*  HCT 28.2* 29.4* 30.8*  PLT 424* 455* 508*   Recent Labs  Lab 08/27/17 0428 08/28/17 0759 08/29/17 0555  NA 140 137 139  K 4.6 3.3* 3.9  CL 103 101 104  CO2 26 24 25   BUN 9 7 11   CREATININE 0.62 0.67 0.65  CALCIUM 8.7* 8.8* 8.8*  GLUCOSE 118* 204* 135*   UDS +cocaine CRP 19  R knee arthocentesis resulted in 143k WBCs, 89 neutorphils, turbid, no organisms. Surgical culture  growing rare Serratia.  Imaging/Diagnostic Tests: Dg Knee Complete 4 Views Right  Result Date: 08/23/2017 CLINICAL DATA:  RIGHT knee pain and swelling for 2 days, no known injury EXAM: RIGHT KNEE - COMPLETE 4+ VIEW COMPARISON:  MR RIGHT knee 03/22/2007 FINDINGS: Low normal osseous mineralization. Tricompartmental joint space narrowing and spur formation.  Bone-on-bone appearance at medial compartment. No acute fracture, dislocation, or bone destruction. Probable calcified loose body posteriorly. Large joint effusion. Remaining soft tissues unremarkable. 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 No definite acute bony abnormalities. Electronically Signed   By: Ulyses Southward M.D.   On: 08/23/2017 15:35   Ellwood Dense, DO 08/29/2017, 1:42 PM PGY-1, [facility] FPTS Intern pager: [phone], text pages welcome

## 2017-08-29 NOTE — Progress Notes (Signed)
Pt given discharge instructions and gone over with him. Answered all questions to satisfaction. Pt in no distress at time of discharge.

## 2017-08-29 NOTE — Discharge Instructions (Signed)
You were admitted for an infection in your joint that was drained twice during this hospitalization. You were started on IV antibiotics and transitioned to by mouth prior to discharge. You will remain on these antibiotics for 6 weeks and follow with Orthopedics outpatient. You can remove your steri strips in 1 week. You will follow up with Orthopedics in 2 weeks for an incision check. Your carvedilol was continued in the hospital but it is imperative that you stop using cocaine as this can be very dangerous to your health and could potentially end in death.    Septic Arthritis Septic arthritis is inflammation of a joint that results from an infection. The infection is caused by germs (bacteria) that enter the joint. Septic arthritis often starts with a painful joint that suddenly gets hot and red. The knee and hip joints are most often affected, but other joints may become infected. Usually, just one joint is infected. What are the causes? Often, septic arthritis is caused by Staphylococcus bacteria. Other bacteria that may lead to the condition include those that cause:  Fungal infections.  Sexually transmitted infections (STIs).  Tuberculosis.  Infection can spread to your joint directly if you:  Have an infection somewhere else in your body that is carried to the joint by your blood. This is the most common cause of septic arthritis.  Have an open wound near a joint.  Have a needle put into your joint.  Have joint surgery.  What increases the risk? You may have a higher risk for septic arthritis if you:  Have an artificial joint.  Have a blood infection.  Had a recent joint surgery or procedure.  Had a recent joint injury.  Have diabetes.  Have arthritis.  Have a condition that weakens your bodys defense system (immune system).  Use intravenous (IV) drugs.  Have gonorrhea.  What are the signs or symptoms? Signs and symptoms of septic arthritis may  include:  Swelling at the joint.  Severe pain in the joint.  Redness and warmth in the joint.  Being unable to move the joint.  Fever and chills.  How is this diagnosed? Your health care provider may do a physical exam. You may also have tests to confirm the diagnosis. These may include:  Blood tests.  Fluid removal from your joint to look for signs of infection (synovial fluid analysis).  X-ray.  How is this treated? You may need to have fluid drained from the joint every day for several days to relieve pain. You may also start treatment with antibiotic medicine. The antibiotics may be given by IV or mouth. Follow these instructions at home:  If you were prescribed an antibiotic medicine, finish it all even if you start to feel better.  Take medicines only as directed by your health care provider.  Use a cool compress on your joint to relieve pain.  Rest your joint until the pain and swelling go away.  Elevate the affected joint when resting.  After the pain and swelling are gone, do light exercises as directed by your health care provider.  Keep all follow-up visits as directed by your health care provider. This is important. Contact a health care provider if:  You have pain that is not controlled with medicine.  You have a fever. Get help right away if: You have signs of worsening infection in your joint. Watch for:  Very bad pain.  Redness.  Warmth.  Swelling.  This information is not intended to replace advice  given to you by your health care provider. Make sure you discuss any questions you have with your health care provider. Document Released: 07/21/2002 Document Revised: 12/24/2015 Document Reviewed: 07/03/2013 Elsevier Interactive Patient Education  2018 [employer]    Weightbearing: WBAT RLE Insicional and dressing care: Dry dressing PRN.  Steri strips in place till 1 week postop at least. Orthopedic device(s): None Showering: ok to shower,  do not soak knee. VTE prophylaxis: Aspirin 81mg  qd 6 weeks Pain control: prn meds, minimize narcs Follow - up plan: 2 weeks for wound check, patient should call for appointment. Contact information: Ramond Marrow MD [phone], [facility]

## 2017-08-29 NOTE — Progress Notes (Signed)
Physical Therapy Treatment Patient Details Name: Larry Mason CLASS MRN: [id number] DOB: December 13, 1975 Today's Date: 08/29/2017    History of Present Illness Pt is a 42 yo male admitted through ED with left knee pain. Pt was found to have an infection in his right knee & underwent an I&D yesterday 08/24/17. PMH significant for CHF, polysubstance abuse (cocaine, heroin), Dm2, obesity, MI.     PT Comments    Pt packing up room to leave.  Instructed patient in use of RW and how to open and close device.  Performed transfer from bed to [facility] and WC to car for d/c home.  Pt reports he will f/u with HHPT.     Follow Up Recommendations  Other (comment)     Equipment Recommendations  Rolling walker with 5" wheels    Recommendations for Other Services       Precautions / Restrictions Precautions Precautions: Fall Restrictions Weight Bearing Restrictions: No    Mobility  Bed Mobility Overal bed mobility: Modified Independent             General bed mobility comments: able to get EOB without assistance, increased time and effort noted pain better controlled.    Transfers Overall transfer level: Needs assistance Equipment used: Rolling walker (2 wheeled) Transfers: Sit to/from Stand Sit to Stand: Supervision         General transfer comment: Cues for hand placement as patient attempting topull on RW into standing.    Ambulation/Gait Ambulation/Gait assistance: Min guard Ambulation Distance (Feet): 6 Feet(From bed to WC.  ) Assistive device: Rolling walker (2 wheeled)(Pt educated on how to open and close RW.  ) Gait Pattern/deviations: Step-to pattern;Antalgic;Decreased step length - left;Decreased stance time - right;Decreased dorsiflexion - right Gait velocity: decreased   General Gait Details: Pt flexed with gait training, R buckling noted and poor UE strength observed.  Pt is deconditioned and limited due to strength deficits and increased pain.     Stairs              Wheelchair Mobility    Modified Rankin (Stroke Patients Only)       Balance Overall balance assessment: Needs assistance   Sitting balance-Leahy Scale: Good       Standing balance-Leahy Scale: Poor                              Cognition Arousal/Alertness: Awake/alert Behavior During Therapy: WFL for tasks assessed/performed Overall Cognitive Status: Within Functional Limits for tasks assessed                                        Exercises      General Comments        Pertinent Vitals/Pain      Home Living                      Prior Function            PT Goals (current goals can now be found in the care plan section) Acute Rehab PT Goals Patient Stated Goal: to be able to straighten his knee Potential to Achieve Goals: Good Progress towards PT goals: Progressing toward goals    Frequency    Min 3X/week      PT Plan Current plan remains appropriate    Co-evaluation  AM-PAC PT "6 Clicks" Daily Activity  Outcome Measure  Difficulty turning over in bed (including adjusting bedclothes, sheets and blankets)?: None Difficulty moving from lying on back to sitting on the side of the bed? : None Difficulty sitting down on and standing up from a chair with arms (e.g., wheelchair, bedside commode, etc,.)?: Unable Help needed moving to and from a bed to chair (including a wheelchair)?: Total Help needed walking in hospital room?: A Little Help needed climbing 3-5 steps with a railing? : A Lot 6 Click Score: 15    End of Session Equipment Utilized During Treatment: Gait belt Activity Tolerance: Patient limited by pain Patient left: in chair;with call bell/phone within reach Nurse Communication: Mobility status PT Visit Diagnosis: Unsteadiness on feet (R26.81);Other abnormalities of gait and mobility (R26.89);Pain Pain - Right/Left: Right Pain - part of body: Knee     Time: 1157-2620 PT  Time Calculation (min) (ACUTE ONLY): 17 min  Charges:  $Therapeutic Activity: 8-22 mins                    G Codes:       Joycelyn Rua, PTA pager [phone]    Florestine Avers 08/29/2017, 3:45 PM

## 2017-08-31 LAB — BODY FLUID CULTURE

## 2017-09-22 ENCOUNTER — Emergency Department (HOSPITAL_COMMUNITY): Payer: Self-pay

## 2017-09-22 ENCOUNTER — Other Ambulatory Visit: Payer: Self-pay

## 2017-09-22 ENCOUNTER — Encounter (HOSPITAL_COMMUNITY): Payer: Self-pay | Admitting: *Deleted

## 2017-09-22 ENCOUNTER — Emergency Department (HOSPITAL_COMMUNITY)
Admission: EM | Admit: 2017-09-22 | Discharge: 2017-09-23 | Disposition: A | Discharge status: Home / Self Care | Payer: Self-pay | Attending: Emergency Medicine | Admitting: Emergency Medicine

## 2017-09-22 DIAGNOSIS — F17210 Nicotine dependence, cigarettes, uncomplicated: Secondary | ICD-10-CM | POA: Insufficient documentation

## 2017-09-22 DIAGNOSIS — I130 Hypertensive heart and chronic kidney disease with heart failure and stage 1 through stage 4 chronic kidney disease, or unspecified chronic kidney disease: Secondary | ICD-10-CM | POA: Insufficient documentation

## 2017-09-22 DIAGNOSIS — I5032 Chronic diastolic (congestive) heart failure: Secondary | ICD-10-CM | POA: Insufficient documentation

## 2017-09-22 DIAGNOSIS — Z7982 Long term (current) use of aspirin: Secondary | ICD-10-CM | POA: Insufficient documentation

## 2017-09-22 DIAGNOSIS — E1122 Type 2 diabetes mellitus with diabetic chronic kidney disease: Secondary | ICD-10-CM | POA: Insufficient documentation

## 2017-09-22 DIAGNOSIS — N189 Chronic kidney disease, unspecified: Secondary | ICD-10-CM | POA: Insufficient documentation

## 2017-09-22 DIAGNOSIS — Z79899 Other long term (current) drug therapy: Secondary | ICD-10-CM | POA: Insufficient documentation

## 2017-09-22 DIAGNOSIS — R609 Edema, unspecified: Secondary | ICD-10-CM | POA: Insufficient documentation

## 2017-09-22 DIAGNOSIS — Z7984 Long term (current) use of oral hypoglycemic drugs: Secondary | ICD-10-CM | POA: Insufficient documentation

## 2017-09-22 LAB — HEPATIC FUNCTION PANEL
ALT: 12 U/L — ABNORMAL LOW (ref 17–63)
AST: 13 U/L — ABNORMAL LOW (ref 15–41)
Albumin: 3 g/dL — ABNORMAL LOW (ref 3.5–5.0)
Alkaline Phosphatase: 73 U/L (ref 38–126)
BILIRUBIN INDIRECT: 0.2 mg/dL — AB (ref 0.3–0.9)
BILIRUBIN TOTAL: 0.3 mg/dL (ref 0.3–1.2)
Bilirubin, Direct: 0.1 mg/dL (ref 0.1–0.5)
TOTAL PROTEIN: 7.3 g/dL (ref 6.5–8.1)

## 2017-09-22 LAB — CBC
HCT: 29.6 % — ABNORMAL LOW (ref 39.0–52.0)
Hemoglobin: 8.7 g/dL — ABNORMAL LOW (ref 13.0–17.0)
MCH: 25.2 pg — ABNORMAL LOW (ref 26.0–34.0)
MCHC: 29.4 g/dL — ABNORMAL LOW (ref 30.0–36.0)
MCV: 85.8 fL (ref 78.0–100.0)
Platelets: 414 10*3/uL — ABNORMAL HIGH (ref 150–400)
RBC: 3.45 MIL/uL — AB (ref 4.22–5.81)
RDW: 16.4 % — ABNORMAL HIGH (ref 11.5–15.5)
WBC: 9.5 10*3/uL (ref 4.0–10.5)

## 2017-09-22 LAB — BASIC METABOLIC PANEL
Anion gap: 7 (ref 5–15)
BUN: 12 mg/dL (ref 6–20)
CHLORIDE: 99 mmol/L — AB (ref 101–111)
CO2: 32 mmol/L (ref 22–32)
Calcium: 9 mg/dL (ref 8.9–10.3)
Creatinine, Ser: 0.7 mg/dL (ref 0.61–1.24)
GFR calc non Af Amer: >60 mL/min (ref >60)
Glucose, Bld: 109 mg/dL — ABNORMAL HIGH (ref 65–99)
Potassium: 4.2 mmol/L (ref 3.5–5.1)
SODIUM: 138 mmol/L (ref 135–145)

## 2017-09-22 LAB — BRAIN NATRIURETIC PEPTIDE: B Natriuretic Peptide: 94.2 pg/mL (ref 0.0–100.0)

## 2017-09-22 LAB — TROPONIN I
TROPONIN I: 0.03 ng/mL — AB (ref <0.03)
Troponin I: 0.03 ng/mL (ref <0.03)

## 2017-09-22 MED ORDER — FUROSEMIDE 10 MG/ML IJ SOLN
20.0000 mg | Freq: Once | INTRAMUSCULAR | Status: AC
  Administered 2017-09-22: 20 mg via INTRAMUSCULAR
  Filled 2017-09-22: qty 2

## 2017-09-22 MED ORDER — FUROSEMIDE 20 MG PO TABS: 20.0000 mg | ORAL_TABLET | Freq: Every day | ORAL | 0 refills | Status: AC

## 2017-09-22 MED ORDER — ACETAMINOPHEN 325 MG PO TABS
650.0000 mg | ORAL_TABLET | Freq: Once | ORAL | Status: AC
  Administered 2017-09-23: 650 mg via ORAL
  Filled 2017-09-22: qty 2

## 2017-09-22 NOTE — Discharge Instructions (Addendum)
Take Lasix as prescribed. Follow up with your cardiologist this week  Contact a health care provider if: You have a fever. Your edema starts suddenly or is getting worse You have swelling in only one leg or worsening in only one leg  You have increased swelling and pain in your legs. You develop shortness of breath, especially when you are lying down or with exertion You have pain in your chest or abdomen.  If you develop worsening or new concerning symptoms you can return to the emergency department for re-evaluation.

## 2017-09-22 NOTE — ED Notes (Signed)
Called Poison control about patients condition.  Awaiting an additional EKG for medical clearance

## 2017-09-22 NOTE — ED Provider Notes (Signed)
[facility] EMERGENCY DEPARTMENT Provider Note   CSN: [id number] Arrival date & time: 09/22/17  1606     History   Chief Complaint Chief Complaint  Patient presents with  . Leg Swelling    HPI Larry Mason is a 42 y.o. male.  HPI Patient presented to the emergency room for evaluation of leg swelling.  Patient states for the last week or so he is noticed bilateral swelling of his ankles and lower legs.  Patient denies any fevers or chills.  He denies any shortness of breath.  He does have some history of intermittent chest discomfort but nothing new.  Patient called his cardiologist who instructed him to come to the ED to be evaluated.  Patient did have surgery on his right knee a few weeks ago but has not noticed any difficulty with redness or swelling around his knee.  Past Medical History:  Diagnosis Date  . CHF (congestive heart failure) (HCC)   . Chronic kidney disease 1984  . Cocaine abuse (HCC)   . Heroin abuse (HCC)   . Hypertension   . IVDU (intravenous drug user)   . Morbid obesity (HCC)   . Myocardial infarction (HCC) 2016  . Type II diabetes mellitus [facility])     Patient Active Problem List   Diagnosis Date Noted  . Polysubstance abuse (HCC)   . Septic joint (HCC) 08/23/2017  . Acute pain of right knee   . Diabetes (HCC) 07/22/2014  . Pain in the chest   . Chronic diastolic heart failure (HCC)   . Chest pain 07/21/2014  . Hypertension   . Elevated troponin 07/20/2014  . Swelling of right lower extremity 07/20/2014  . Heroin abuse (HCC) 07/20/2014  . Cocaine abuse (HCC) 07/20/2014  . SOB (shortness of breath) 07/20/2014  . Dyspnea 07/20/2014    Past Surgical History:  Procedure Laterality Date  . ANTERIOR CRUCIATE LIGAMENT REPAIR Right 1990s  . KNEE ARTHROSCOPY Right 08/24/2017   Procedure: ARTHROSCOPY KNEE IRRIGATION AND DEBRIDEMENT;  Surgeon: Bjorn Pippin, MD;  Location: [facility] OR;  Service: Orthopedics;  Laterality: Right;  . KNEE  ARTHROSCOPY Right 08/27/2017   w/I&D  . KNEE ARTHROSCOPY Right 08/27/2017   Procedure: ARTHROSCOPY AND I&D RIGHT KNEE;  Surgeon: Bjorn Pippin, MD;  Location: [facility] OR;  Service: Orthopedics;  Laterality: Right;  . LEFT HEART CATHETERIZATION WITH CORONARY ANGIOGRAM N/A 07/22/2014   Procedure: LEFT HEART CATHETERIZATION WITH CORONARY ANGIOGRAM;  Surgeon: Rinaldo Cloud, MD;  Location: [facility] CATH LAB;  Service: Cardiovascular;  Laterality: N/A;        Home Medications    Prior to Admission medications   Medication Sig Start Date End Date Taking? Authorizing Provider  aspirin 81 MG EC tablet Take 1 tablet (81 mg total) by mouth daily. 08/29/17 10/06/17  Ellwood Dense, DO  carvedilol (COREG) 25 MG tablet Take 25 mg by mouth 2 (two) times daily with a meal.    [provider]  ciprofloxacin (CIPRO) 500 MG tablet Take 1 tablet (500 mg total) by mouth 2 (two) times daily. 08/29/17 10/06/17  Ellwood Dense, DO  ibuprofen (ADVIL,MOTRIN) 200 MG tablet Take 800 mg by mouth every 6 (six) hours as needed for moderate pain (pain and swelling).    [provider]  lisinopril (PRINIVIL,ZESTRIL) 20 MG tablet Take 1 tablet (20 mg total) by mouth daily. 08/29/17   Ellwood Dense, DO  metFORMIN (GLUCOPHAGE) 500 MG tablet Take 1 tablet (500 mg total) by mouth daily with breakfast. 08/29/17  08/29/18  Ellwood Dense, DO  spironolactone (ALDACTONE) 25 MG tablet Take 1 tablet (25 mg total) by mouth daily. 07/22/14   Vassie Loll, MD    Family History Family History  Problem Relation Age of Onset  . Diabetes Mellitus II Mother   . Hypertension Father     Social History Social History   Tobacco Use  . Smoking status: Current Every Day Smoker    Packs/day: 0.50    Years: 5.00    Pack years: 2.50    Types: Cigarettes  . Smokeless tobacco: Never Used  Substance Use Topics  . Alcohol use: No  . Drug use: Yes    Types: Cocaine, Heroin    Comment: 08/27/2017 "nothing in the last 8 months or so"      Allergies   Patient has no known allergies.   Review of Systems Review of Systems  Constitutional: Negative for fever.  Respiratory: Negative for shortness of breath.   All other systems reviewed and are negative.    Physical Exam Updated Vital Signs BP (!) 134/59 (BP Location: Right Arm)   Pulse 75   Temp 98.5 F (36.9 C) (Oral)   Resp 16   Ht 1.702 m (5\' 7" )   Wt 131.5 kg (290 lb)   SpO2 100%   BMI 45.42 kg/m   Physical Exam  Constitutional: He appears well-developed and well-nourished. No distress.  Overweight  HENT:  Head: Normocephalic and atraumatic.  Right Ear: External ear normal.  Left Ear: External ear normal.  Eyes: Conjunctivae are normal. Right eye exhibits no discharge. Left eye exhibits no discharge. No scleral icterus.  Neck: Neck supple. No tracheal deviation present.  Cardiovascular: Normal rate, regular rhythm and intact distal pulses.  Pulmonary/Chest: Effort normal and breath sounds normal. No stridor. No respiratory distress. He has no wheezes. He has no rales.  Abdominal: Soft. Bowel sounds are normal. He exhibits no distension. There is no tenderness. There is no rebound and no guarding.  Musculoskeletal: He exhibits edema. He exhibits no tenderness.  Pitting edema bilateral lower extremities to the knees, no erythema, no tenderness  Neurological: He is alert. He has normal strength. No cranial nerve deficit (no facial droop, extraocular movements intact, no slurred speech) or sensory deficit. He exhibits normal muscle tone. He displays no seizure activity. Coordination normal.  Skin: Skin is warm and dry. No rash noted.  Psychiatric: He has a normal mood and affect.  Nursing note and vitals reviewed.    ED Treatments / Results  Labs (all labs ordered are listed, but only abnormal results are displayed) Labs Reviewed  BASIC METABOLIC PANEL - Abnormal; Notable for the following components:      Result Value   Chloride 99 (*)     Glucose, Bld 109 (*)    All other components within normal limits  CBC - Abnormal; Notable for the following components:   RBC 3.45 (*)    Hemoglobin 8.7 (*)    HCT 29.6 (*)    MCH 25.2 (*)    MCHC 29.4 (*)    RDW 16.4 (*)    Platelets 414 (*)    All other components within normal limits  TROPONIN I - Abnormal; Notable for the following components:   Troponin I 0.03 (*)    All other components within normal limits  BRAIN NATRIURETIC PEPTIDE  HEPATIC FUNCTION PANEL  TROPONIN I    EKG EKG Interpretation  Date/Time:  Sunday Sep 22 2017 16:23:03 EDT Ventricular Rate:  88 PR  Interval:  130 QRS Duration: 102 QT Interval:  390 QTC Calculation: 471 R Axis:   22 Text Interpretation:  Sinus rhythm with Premature atrial complexes with Abberant conduction Otherwise normal ECG No significant change since last tracing Confirmed by Linwood Dibbles [phone]) on 09/22/2017 10:18:02 PM   Radiology Dg Chest 2 View  Result Date: 09/22/2017 CLINICAL DATA:  Chest pain for 3 months. Bilateral lower extremity pain for 1 week. EXAM: CHEST - 2 VIEW COMPARISON:  CT chest and PA and lateral chest 07/20/2014. FINDINGS: Lungs are clear. Heart size is upper normal. No pneumothorax or pleural effusion. No focal bony abnormality. IMPRESSION: No acute disease. Electronically Signed   By: Drusilla Kanner M.D.   On: 09/22/2017 16:57    Procedures Procedures (including critical care time)  Medications Ordered in ED Medications  furosemide (LASIX) injection 20 mg (20 mg Intramuscular Given 09/22/17 2213)     Initial Impression / Assessment and Plan / ED Course  I have reviewed the triage vital signs and the nursing notes.  Pertinent labs & imaging results that were available during my care of the patient were reviewed by me and considered in my medical decision making (see chart for details).  Clinical Course as of Sep 22 2221  Wynelle Link Sep 22, 2017  2136 Labs reviewed.  Slight increase in troponin but just at  the borderline.  Plan on repeat troponin.  Labs otherwise are consistent with his chronic anemia.  Chest x-ray without signs of congestive heart failure   [JK]    Clinical Course User Index [JK] Linwood Dibbles, MD    Patient presents with peripheral edema.   His edema is bilateral.  I doubt DVT.  Patient does have history of congestive heart failure.  No signs of pulmonary edema.  Patient does have a slight increase in his troponin I suspect this is related to his congestive heart failure.  Plan on checking a second troponin.  As long as it is stable I think the patient can be safely discharged on Lasix and close follow-up with his cardiologist this week.  Care turned over to oncoming provider pending second troponin  Final Clinical Impressions(s) / ED Diagnoses   Final diagnoses:  Peripheral edema    ED Discharge Orders    None       Linwood Dibbles, MD 09/22/17 2224

## 2017-09-22 NOTE — ED Provider Notes (Signed)
Care assumed from Dr. Lynelle Doctor at shift change with second Tn pending. Please see his note for further details.   In brief, this patient is a 42 year old male with a history of CKD, CHF, diabetes, drug abuse, prior MI who presents emergency department today for lower leg swelling.  Patient really recently underwent right knee surgery a few weeks ago but has not noticed any pain/swelling/redness around the knee.  He denies any fevers at home.  He was told to come here today because he had bilateral lower leg swelling.   He does note history that he has intermittent episodes of chest discomfort but denies any at this current time and states this is not new.  He states it is not similar to when he had a previous MI.  He denies any exertional chest pain or associated shortness of breath, difficulty breathing, orthopnea, dyspnea on exertion, nausea/vomiting or diaphoresis.  He is on spironolactone for his CHF and has been taking as prescribed.  He is not on Lasix.   Gen: afebrile, VSS HEENT: Atraumatic, EOMI Resp: no resp distress. No increased work of breathing. No accessory muscle use. Patient is sitting upright, speaking in full sentences without difficulty  CV: RRR. 1+ b/l lower leg edema. Calves are symmetric in size b/l. Negative homans test b/l.  Abd: soft, NT, ND MsK: moving all extremities well. No swelling, redness or heat over right knee. Neuro: A&O x4  PLAN: If Tn remains 0.03 or downtrends, plan to discharge the patient home with close follow up.   MDM:  42 year old male who presents with bilateral lower leg swelling.  No new chest pain or any associated shortness of breath or dyspnea on exertion.  BNP within normal limits.  Chest x-ray without evidence of pulmonary edema.  Patient did have a mildly elevated troponin at 0.03.  We suspect to be likely secondary to his CHF.  Plan was to cycle and ensure that this remains the same or has decreased.  Lab work reviewed and otherwise near baseline.   Patient did have recent surgery on right knee.  Patient's vital signs are reassuring and he has no joint swelling, overlying erythema or heat.  Do not suspect infected joint.  Doubt bilateral DVT.  Patient given Lasix in the department has started diuresing.  Repeat troponin remained 0.03. EKG without evidence of STEMI. No significant change from prior. No current chest pain.  Will discharge patient home on Lasix with close follow-up with cardiology Sharyn Lull) and PCP.  Recommended that patient present if he has any developing chest pain with radiation to his neck, jaw, shoulder, back with associated nausea, vomiting, emesis or shortness of breath. Specific return precautions discussed. Time was given for all questions to be answered. The patient verbalized understanding and agreement with plan. The patient appears safe for discharge home.  1. Peripheral edema       Princella Pellegrini 09/23/17 Gretta Arab, MD 09/24/17 Windy Fast

## 2017-09-22 NOTE — ED Triage Notes (Signed)
The pt is c/o bi-lateral swelling feet ankles and legs for one week  Rt knee surgery 2-3 weeks ago also

## 2017-09-22 NOTE — ED Notes (Signed)
Patient transported to X-ray 

## 2017-09-25 ENCOUNTER — Inpatient Hospital Stay (INDEPENDENT_AMBULATORY_CARE_PROVIDER_SITE_OTHER): Payer: Self-pay | Admitting: Nurse Practitioner

## 2017-10-24 ENCOUNTER — Inpatient Hospital Stay: Payer: Self-pay | Admitting: Family

## 2017-10-24 NOTE — Progress Notes (Deleted)
Subjective:    Patient ID: Larry Mason, male    DOB: July 30, 1975, 42 y.o.   MRN: [id number]  No chief complaint on file.   HPI:  Larry Mason is a 42 y.o. male who presents today    No Known Allergies    Outpatient Medications Prior to Visit  Medication Sig Dispense Refill  . carvedilol (COREG) 25 MG tablet Take 25 mg by mouth 2 (two) times daily with a meal.    . furosemide (LASIX) 20 MG tablet Take 1-2 tablets (20-40 mg total) by mouth daily. 14 tablet 0  . ibuprofen (ADVIL,MOTRIN) 200 MG tablet Take 800 mg by mouth every 6 (six) hours as needed for moderate pain (pain and swelling).    Marland Kitchen lisinopril (PRINIVIL,ZESTRIL) 20 MG tablet Take 1 tablet (20 mg total) by mouth daily. 30 tablet 0  . metFORMIN (GLUCOPHAGE) 500 MG tablet Take 1 tablet (500 mg total) by mouth daily with breakfast. 30 tablet 11  . spironolactone (ALDACTONE) 25 MG tablet Take 1 tablet (25 mg total) by mouth daily. 30 tablet 1   No facility-administered medications prior to visit.      Past Medical History:  Diagnosis Date  . CHF (congestive heart failure) (HCC)   . Chronic kidney disease 1984  . Cocaine abuse (HCC)   . Heroin abuse (HCC)   . Hypertension   . IVDU (intravenous drug user)   . Morbid obesity (HCC)   . Myocardial infarction (HCC) 2016  . Type II diabetes mellitus (HCC)       Past Surgical History:  Procedure Laterality Date  . ANTERIOR CRUCIATE LIGAMENT REPAIR Right 1990s  . KNEE ARTHROSCOPY Right 08/24/2017   Procedure: ARTHROSCOPY KNEE IRRIGATION AND DEBRIDEMENT;  Surgeon: Bjorn Pippin, MD;  Location: [facility] OR;  Service: Orthopedics;  Laterality: Right;  . KNEE ARTHROSCOPY Right 08/27/2017   w/I&D  . KNEE ARTHROSCOPY Right 08/27/2017   Procedure: ARTHROSCOPY AND I&D RIGHT KNEE;  Surgeon: Bjorn Pippin, MD;  Location: [facility] OR;  Service: Orthopedics;  Laterality: Right;  . LEFT HEART CATHETERIZATION WITH CORONARY ANGIOGRAM N/A 07/22/2014   Procedure: LEFT HEART  CATHETERIZATION WITH CORONARY ANGIOGRAM;  Surgeon: Rinaldo Cloud, MD;  Location: [facility] CATH LAB;  Service: Cardiovascular;  Laterality: N/A;      Family History  Problem Relation Age of Onset  . Diabetes Mellitus II Mother   . Hypertension Father       Social History   Socioeconomic History  . Marital status: Divorced    Spouse name: Not on file  . Number of children: Not on file  . Years of education: Not on file  . Highest education level: Not on file  Occupational History  . Not on file  Social Needs  . Financial resource strain: Not on file  . Food insecurity:    Worry: Not on file    Inability: Not on file  . Transportation needs:    Medical: Not on file    Non-medical: Not on file  Tobacco Use  . Smoking status: Current Every Day Smoker    Packs/day: 0.50    Years: 5.00    Pack years: 2.50    Types: Cigarettes  . Smokeless tobacco: Never Used  Substance and Sexual Activity  . Alcohol use: No  . Drug use: Yes    Types: Cocaine, Heroin    Comment: 08/27/2017 "nothing in the last 8 months or so"  . Sexual activity: Not Currently  Lifestyle  . Physical activity:  Days per week: Not on file    Minutes per session: Not on file  . Stress: Not on file  Relationships  . Social connections:    Talks on phone: Not on file    Gets together: Not on file    Attends religious service: Not on file    Active member of club or organization: Not on file    Attends meetings of clubs or organizations: Not on file    Relationship status: Not on file  . Intimate partner violence:    Fear of current or ex partner: Not on file    Emotionally abused: Not on file    Physically abused: Not on file    Forced sexual activity: Not on file  Other Topics Concern  . Not on file  Social History Narrative  . Not on file      Review of Systems     Objective:    There were no vitals taken for this visit. Nursing note and vital signs reviewed.  Physical Exam       Assessment & Plan:   Problem List Items Addressed This Visit    None       I am having Larry Francis. Mason maintain his ibuprofen, spironolactone, carvedilol, lisinopril, metFORMIN, and furosemide.   No orders of the defined types were placed in this encounter.    Follow-up: No follow-ups on file.  Jeanine Luz, FNP [facility]

## 2018-09-10 ENCOUNTER — Other Ambulatory Visit: Payer: Self-pay | Admitting: Family Medicine

## 2018-10-24 IMAGING — DX DG KNEE COMPLETE 4+V*R*
4 series · 4 of 4 positions shown · non-contrast
Comparison: MR RIGHT knee 03/22/2007

CLINICAL DATA: RIGHT knee pain and swelling for 2 days, no known
injury

EXAM:
RIGHT KNEE - COMPLETE 4+ VIEW

[knee ap]
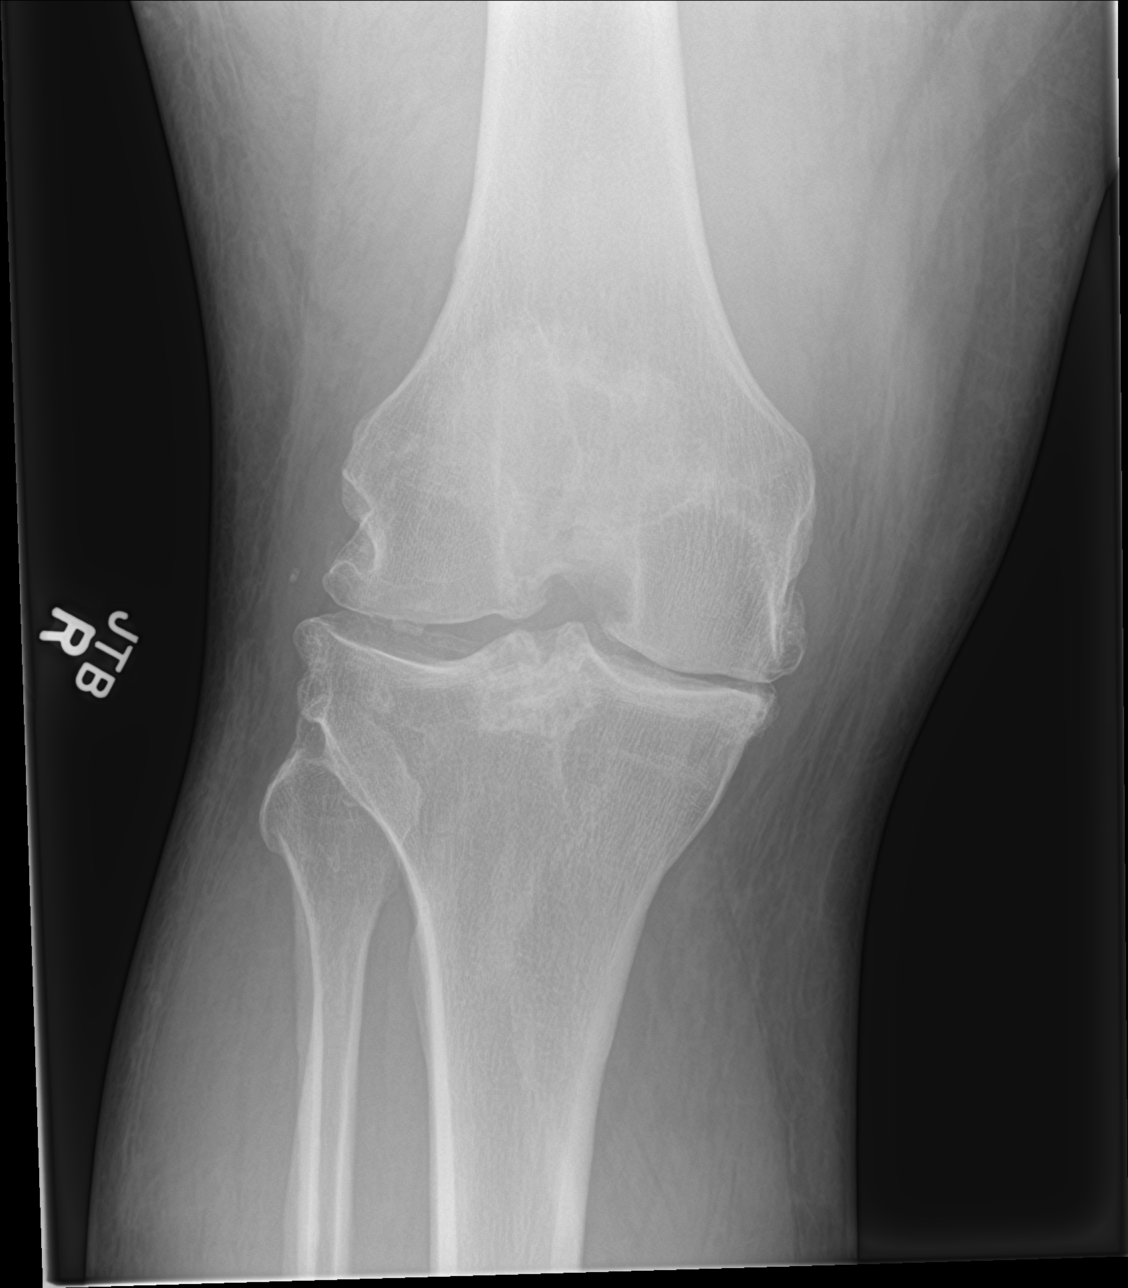

[knee lat]
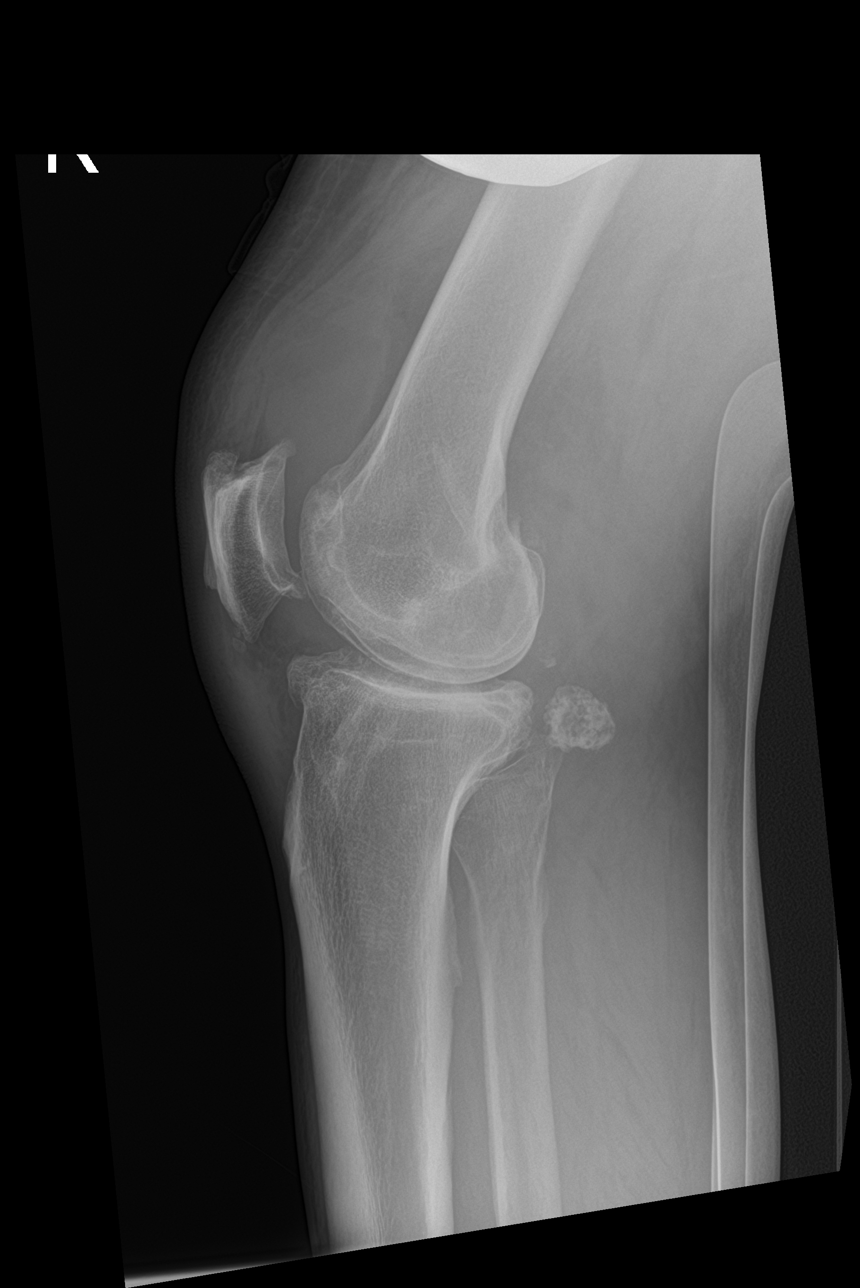

[knee obl (1 of 2)]
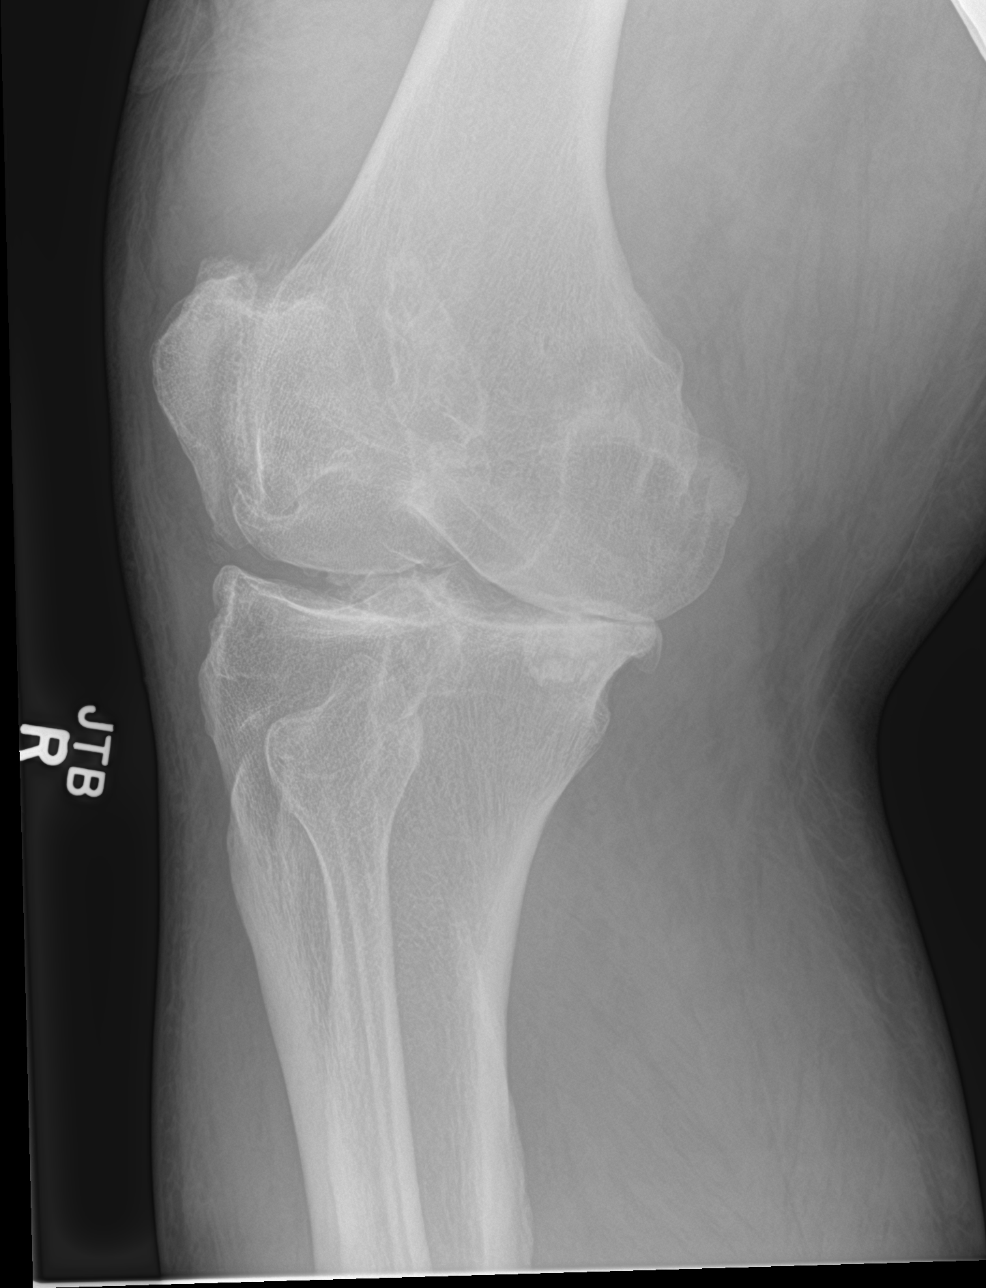

[knee obl (2 of 2)]
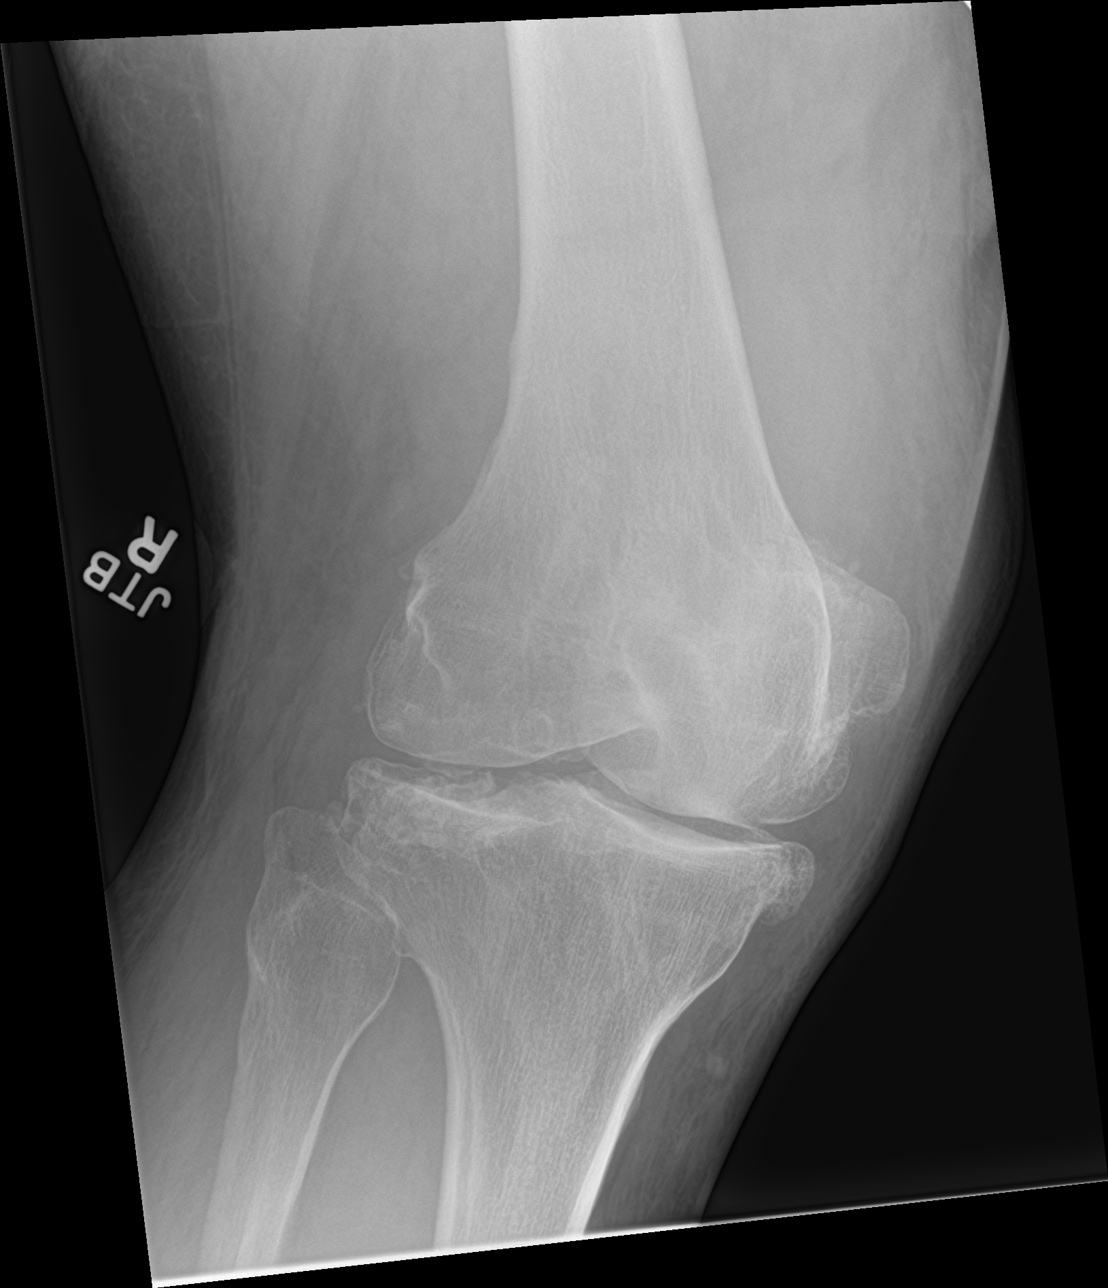

[4 of 4 positions shown; findings below may reference images not displayed]

FINDINGS: Low normal osseous mineralization.

Tricompartmental joint space narrowing and spur formation.

Bone-on-bone appearance at medial compartment.

No acute fracture, dislocation, or bone destruction.

Probable calcified loose body posteriorly.

Large joint effusion.

Remaining soft tissues unremarkable.
IMPRESSION: Advanced tricompartmental osteoarthritic changes of RIGHT knee
isocenter with a large joint effusion and a probable large calcified
loose body at the posterior knee.

No definite acute bony abnormalities.

## 2018-11-23 IMAGING — CR DG CHEST 2V
2 series · 2 of 2 positions shown · non-contrast
Comparison: CT chest and PA and lateral chest 07/20/2014.

CLINICAL DATA: Chest pain for 3 months. Bilateral lower extremity
pain for 1 week.

EXAM:
CHEST - 2 VIEW

[chest pa]
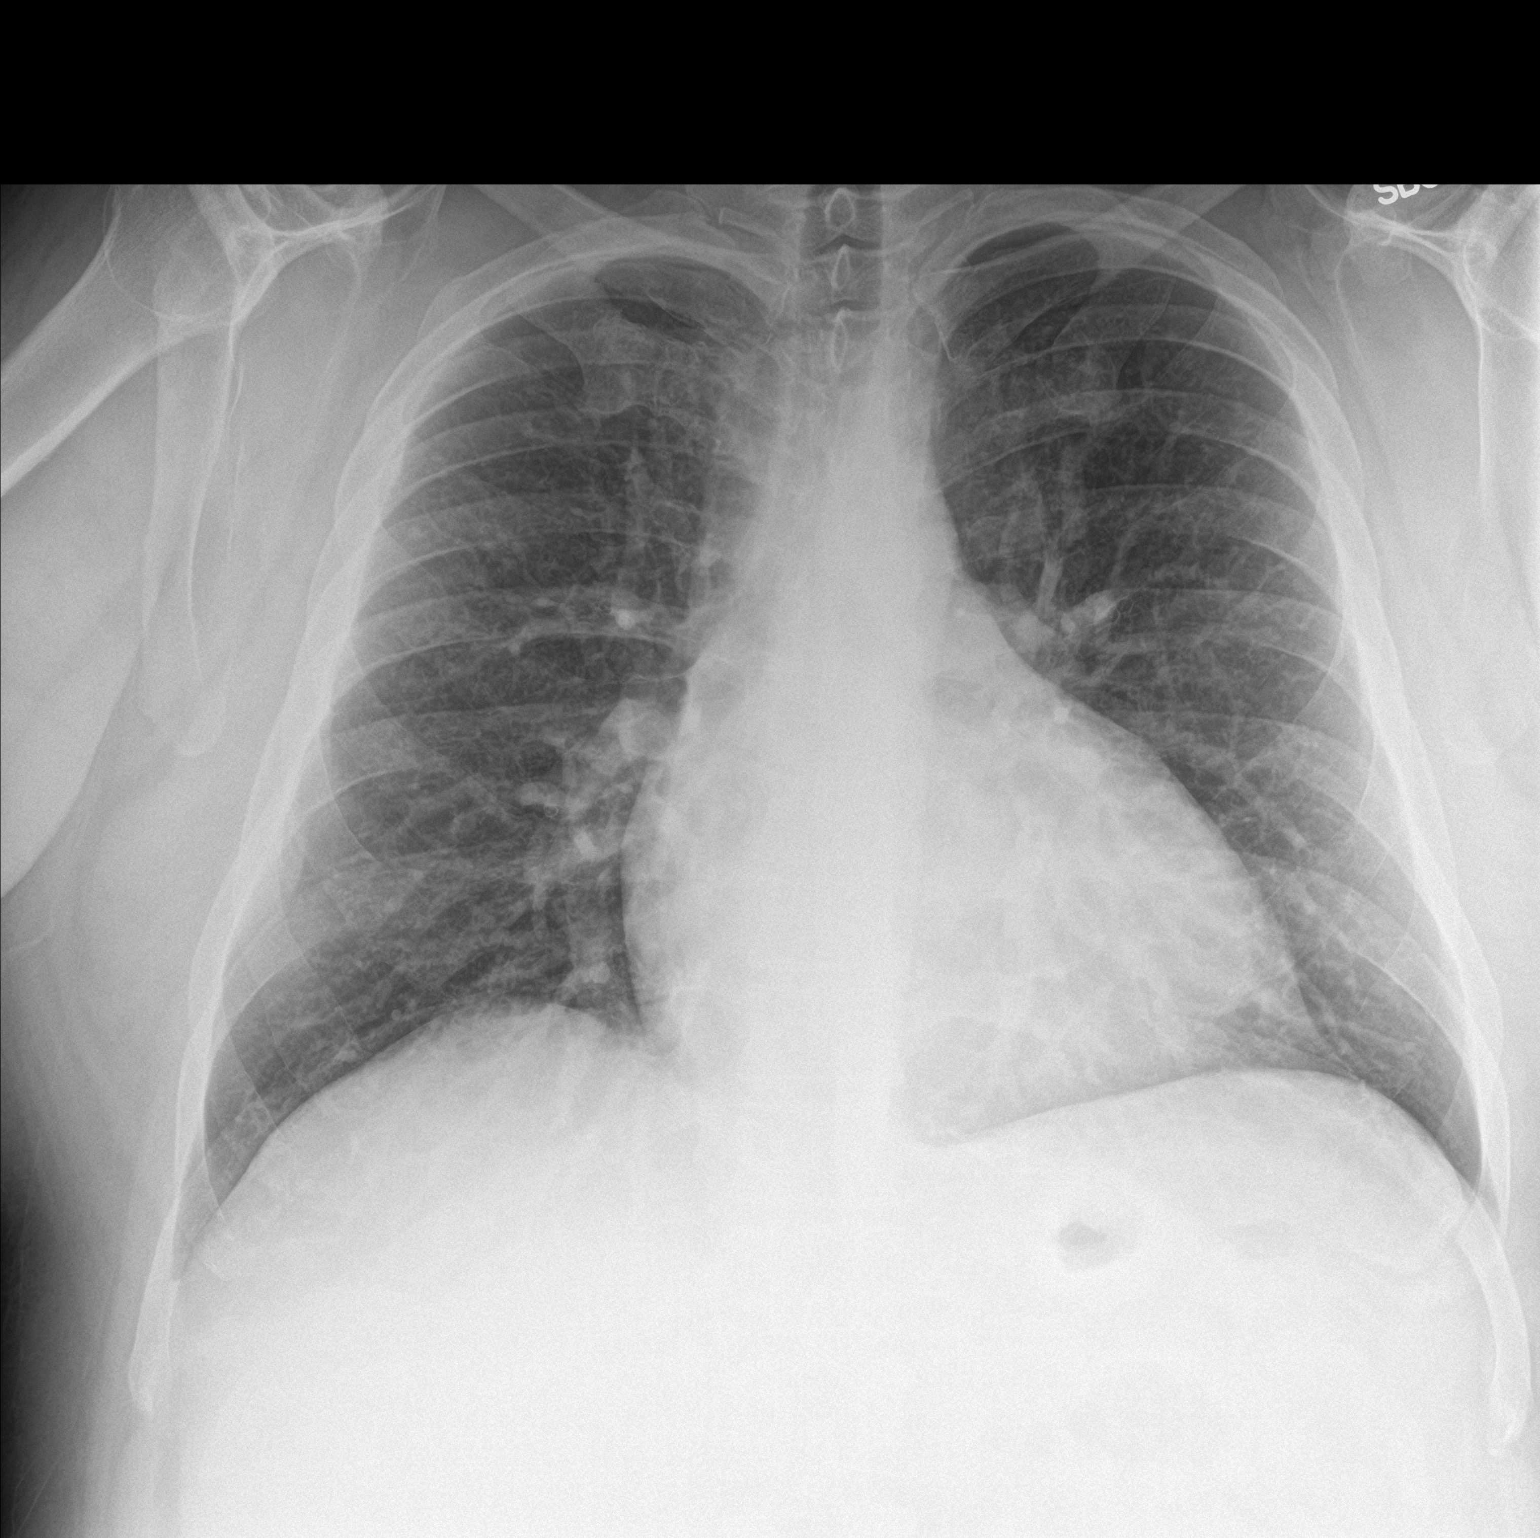

[chest lat]
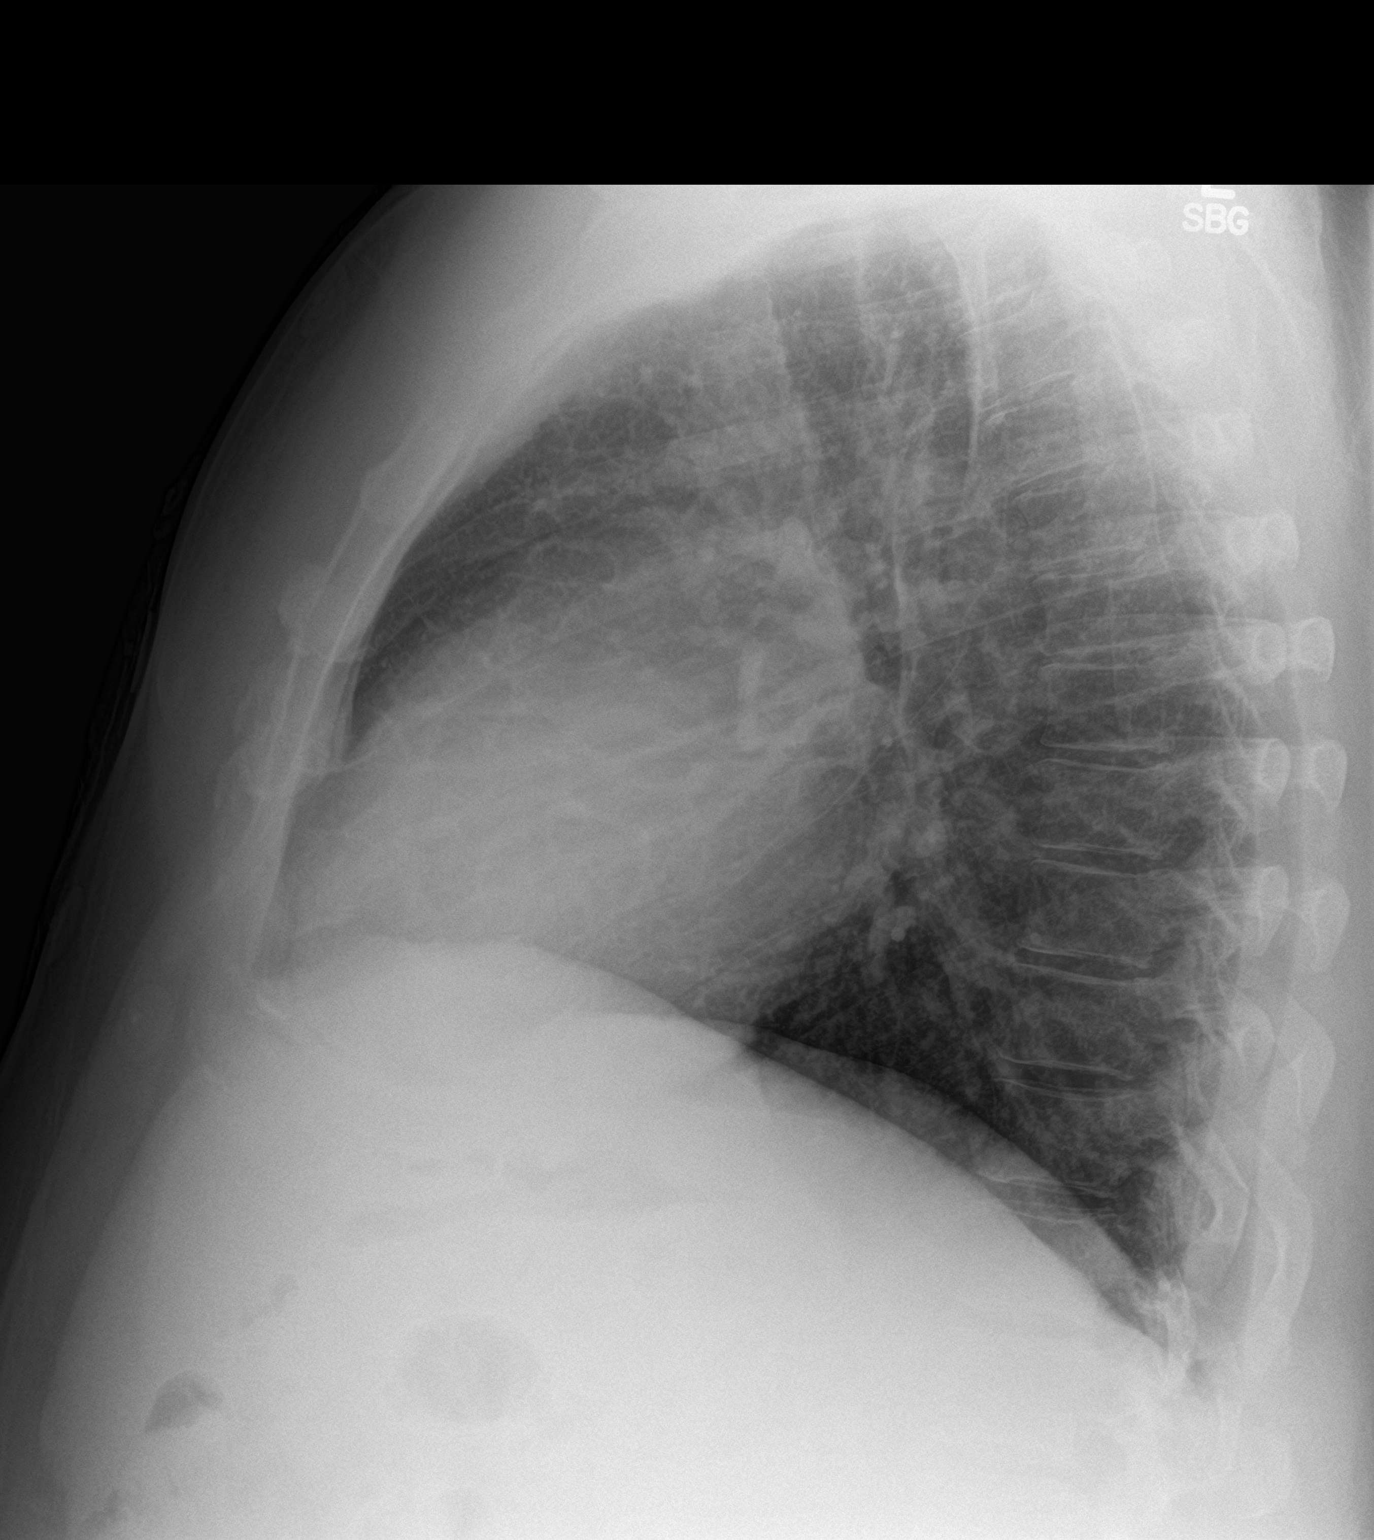

[2 of 2 positions shown; findings below may reference images not displayed]

FINDINGS: Lungs are clear. Heart size is upper normal. No pneumothorax or
pleural effusion. No focal bony abnormality.
IMPRESSION: No acute disease.

## 2023-07-18 ENCOUNTER — Ambulatory Visit: Payer: Medicare HMO | Admitting: Podiatry
